# Patient Record
Sex: Male | Born: 1980 | Race: Black or African American | Hispanic: No | Marital: Single | Smoking: Former smoker
Health system: Southern US, Community
[De-identification: ages and names within clinical notes are randomized; demographics above are authoritative.]

## PROBLEM LIST (undated history)

## (undated) ENCOUNTER — Ambulatory Visit (HOSPITAL_COMMUNITY): Payer: BLUE CROSS/BLUE SHIELD | Source: Home / Self Care

## (undated) DIAGNOSIS — F419 Anxiety disorder, unspecified: Secondary | ICD-10-CM

## (undated) DIAGNOSIS — E785 Hyperlipidemia, unspecified: Secondary | ICD-10-CM

## (undated) DIAGNOSIS — M5430 Sciatica, unspecified side: Secondary | ICD-10-CM

## (undated) DIAGNOSIS — M1990 Unspecified osteoarthritis, unspecified site: Secondary | ICD-10-CM

## (undated) DIAGNOSIS — W3400XA Accidental discharge from unspecified firearms or gun, initial encounter: Secondary | ICD-10-CM

## (undated) DIAGNOSIS — F410 Panic disorder [episodic paroxysmal anxiety] without agoraphobia: Secondary | ICD-10-CM

## (undated) DIAGNOSIS — J45909 Unspecified asthma, uncomplicated: Secondary | ICD-10-CM

## (undated) DIAGNOSIS — I10 Essential (primary) hypertension: Secondary | ICD-10-CM

## (undated) DIAGNOSIS — Z8616 Personal history of COVID-19: Secondary | ICD-10-CM

## (undated) DIAGNOSIS — I2510 Atherosclerotic heart disease of native coronary artery without angina pectoris: Secondary | ICD-10-CM

## (undated) HISTORY — DX: Anxiety disorder, unspecified: F41.9

## (undated) HISTORY — DX: Personal history of COVID-19: Z86.16

## (undated) HISTORY — DX: Accidental discharge from unspecified firearms or gun, initial encounter: W34.00XA

## (undated) HISTORY — DX: Hyperlipidemia, unspecified: E78.5

## (undated) HISTORY — DX: Panic disorder (episodic paroxysmal anxiety): F41.0

## (undated) HISTORY — PX: APPENDECTOMY: SHX54

---

## 1998-12-05 ENCOUNTER — Emergency Department (HOSPITAL_COMMUNITY): Admission: EM | Admit: 1998-12-05 | Discharge: 1998-12-05 | Payer: Self-pay | Admitting: Emergency Medicine

## 1999-06-13 ENCOUNTER — Emergency Department (HOSPITAL_COMMUNITY): Admission: EM | Admit: 1999-06-13 | Discharge: 1999-06-13 | Payer: Self-pay | Admitting: Emergency Medicine

## 1999-06-17 ENCOUNTER — Emergency Department (HOSPITAL_COMMUNITY): Admission: EM | Admit: 1999-06-17 | Discharge: 1999-06-17 | Payer: Self-pay | Admitting: Emergency Medicine

## 1999-11-16 ENCOUNTER — Emergency Department (HOSPITAL_COMMUNITY): Admission: EM | Admit: 1999-11-16 | Discharge: 1999-11-16 | Payer: Self-pay | Admitting: Podiatry

## 2000-08-22 ENCOUNTER — Emergency Department (HOSPITAL_COMMUNITY): Admission: EM | Admit: 2000-08-22 | Discharge: 2000-08-22 | Payer: Self-pay | Admitting: Emergency Medicine

## 2000-08-22 ENCOUNTER — Encounter: Payer: Self-pay | Admitting: Emergency Medicine

## 2000-08-24 ENCOUNTER — Emergency Department (HOSPITAL_COMMUNITY): Admission: EM | Admit: 2000-08-24 | Discharge: 2000-08-24 | Payer: Self-pay | Admitting: Emergency Medicine

## 2002-01-27 ENCOUNTER — Emergency Department (HOSPITAL_COMMUNITY): Admission: EM | Admit: 2002-01-27 | Discharge: 2002-01-27 | Payer: Self-pay | Admitting: Emergency Medicine

## 2002-02-06 ENCOUNTER — Emergency Department (HOSPITAL_COMMUNITY): Admission: EM | Admit: 2002-02-06 | Discharge: 2002-02-06 | Payer: Self-pay | Admitting: Emergency Medicine

## 2002-02-08 ENCOUNTER — Emergency Department (HOSPITAL_COMMUNITY): Admission: EM | Admit: 2002-02-08 | Discharge: 2002-02-08 | Payer: Self-pay

## 2002-03-26 ENCOUNTER — Emergency Department (HOSPITAL_COMMUNITY): Admission: EM | Admit: 2002-03-26 | Discharge: 2002-03-26 | Payer: Self-pay | Admitting: Emergency Medicine

## 2002-03-27 ENCOUNTER — Encounter: Payer: Self-pay | Admitting: Emergency Medicine

## 2002-08-11 ENCOUNTER — Emergency Department (HOSPITAL_COMMUNITY): Admission: EM | Admit: 2002-08-11 | Discharge: 2002-08-11 | Payer: Self-pay | Admitting: Emergency Medicine

## 2002-08-11 ENCOUNTER — Encounter: Payer: Self-pay | Admitting: Emergency Medicine

## 2002-10-14 ENCOUNTER — Emergency Department (HOSPITAL_COMMUNITY): Admission: EM | Admit: 2002-10-14 | Discharge: 2002-10-14 | Payer: Self-pay | Admitting: Emergency Medicine

## 2004-02-16 ENCOUNTER — Emergency Department (HOSPITAL_COMMUNITY): Admission: EM | Admit: 2004-02-16 | Discharge: 2004-02-16 | Payer: Self-pay | Admitting: Emergency Medicine

## 2005-09-24 ENCOUNTER — Emergency Department (HOSPITAL_COMMUNITY): Admission: EM | Admit: 2005-09-24 | Discharge: 2005-09-24 | Payer: Self-pay | Admitting: *Deleted

## 2005-11-01 ENCOUNTER — Emergency Department (HOSPITAL_COMMUNITY): Admission: EM | Admit: 2005-11-01 | Discharge: 2005-11-01 | Payer: Self-pay | Admitting: Emergency Medicine

## 2006-01-18 ENCOUNTER — Emergency Department (HOSPITAL_COMMUNITY): Admission: EM | Admit: 2006-01-18 | Discharge: 2006-01-18 | Payer: Self-pay | Admitting: *Deleted

## 2006-02-19 ENCOUNTER — Emergency Department (HOSPITAL_COMMUNITY): Admission: EM | Admit: 2006-02-19 | Discharge: 2006-02-19 | Payer: Self-pay | Admitting: Emergency Medicine

## 2006-05-17 ENCOUNTER — Emergency Department (HOSPITAL_COMMUNITY): Admission: EM | Admit: 2006-05-17 | Discharge: 2006-05-17 | Payer: Self-pay | Admitting: Emergency Medicine

## 2006-10-09 ENCOUNTER — Emergency Department (HOSPITAL_COMMUNITY): Admission: EM | Admit: 2006-10-09 | Discharge: 2006-10-09 | Payer: Self-pay | Admitting: Emergency Medicine

## 2006-10-10 ENCOUNTER — Emergency Department (HOSPITAL_COMMUNITY): Admission: EM | Admit: 2006-10-10 | Discharge: 2006-10-10 | Payer: Self-pay | Admitting: Emergency Medicine

## 2006-10-12 ENCOUNTER — Emergency Department (HOSPITAL_COMMUNITY): Admission: EM | Admit: 2006-10-12 | Discharge: 2006-10-12 | Payer: Self-pay | Admitting: Emergency Medicine

## 2006-10-13 ENCOUNTER — Emergency Department (HOSPITAL_COMMUNITY): Admission: EM | Admit: 2006-10-13 | Discharge: 2006-10-13 | Payer: Self-pay | Admitting: Emergency Medicine

## 2006-10-19 ENCOUNTER — Ambulatory Visit: Payer: Self-pay | Admitting: Family Medicine

## 2006-10-27 ENCOUNTER — Ambulatory Visit: Payer: Self-pay | Admitting: Family Medicine

## 2006-10-28 ENCOUNTER — Emergency Department (HOSPITAL_COMMUNITY): Admission: EM | Admit: 2006-10-28 | Discharge: 2006-10-28 | Payer: Self-pay | Admitting: Emergency Medicine

## 2006-11-02 ENCOUNTER — Emergency Department (HOSPITAL_COMMUNITY): Admission: EM | Admit: 2006-11-02 | Discharge: 2006-11-02 | Payer: Self-pay | Admitting: Emergency Medicine

## 2006-11-05 ENCOUNTER — Emergency Department (HOSPITAL_COMMUNITY): Admission: EM | Admit: 2006-11-05 | Discharge: 2006-11-05 | Payer: Self-pay | Admitting: Emergency Medicine

## 2006-11-11 ENCOUNTER — Emergency Department (HOSPITAL_COMMUNITY): Admission: EM | Admit: 2006-11-11 | Discharge: 2006-11-11 | Payer: Self-pay | Admitting: Emergency Medicine

## 2007-09-08 ENCOUNTER — Encounter (INDEPENDENT_AMBULATORY_CARE_PROVIDER_SITE_OTHER): Payer: Self-pay | Admitting: *Deleted

## 2007-09-29 ENCOUNTER — Emergency Department (HOSPITAL_COMMUNITY): Admission: EM | Admit: 2007-09-29 | Discharge: 2007-09-29 | Payer: Self-pay | Admitting: Emergency Medicine

## 2008-03-01 ENCOUNTER — Emergency Department (HOSPITAL_COMMUNITY): Admission: EM | Admit: 2008-03-01 | Discharge: 2008-03-01 | Payer: Self-pay | Admitting: Emergency Medicine

## 2008-06-01 ENCOUNTER — Emergency Department (HOSPITAL_COMMUNITY): Admission: EM | Admit: 2008-06-01 | Discharge: 2008-06-01 | Payer: Self-pay | Admitting: Emergency Medicine

## 2008-11-30 ENCOUNTER — Emergency Department: Payer: Self-pay | Admitting: Emergency Medicine

## 2008-11-30 ENCOUNTER — Emergency Department (HOSPITAL_COMMUNITY): Admission: EM | Admit: 2008-11-30 | Discharge: 2008-11-30 | Payer: Self-pay | Admitting: Emergency Medicine

## 2008-12-04 ENCOUNTER — Emergency Department (HOSPITAL_COMMUNITY): Admission: EM | Admit: 2008-12-04 | Discharge: 2008-12-04 | Payer: Self-pay | Admitting: Emergency Medicine

## 2009-05-09 ENCOUNTER — Emergency Department: Payer: Self-pay | Admitting: Emergency Medicine

## 2011-01-21 NOTE — Miscellaneous (Signed)
Summary: VIP  Patient: Kristopher Dunn Note: All result statuses are Final unless otherwise noted.  Tests: (1) VIP (Medications)   LLIMPORTMEDS              "Result Below..."       RESULT: NABUMETONE TABS 500 MG*TAKE ONE TABLET BY MOUTH TWICE DAILY AFTER MEALS FOR SHOULDER PAIN*10/19/2007*Last Refill: [email or web address]*******   LLIMPORTMEDS              "Result Below..."       RESULT: CYCLOBENZAPRINE HCL TABS 10 MG*TAKE ONE (1) TABLET AT BEDTIME FOR PAIN AND SPASM  Generic for FLEXERIL 10MG  TAB*10/19/2007*Last Refill: [id number]*******   LLIMPORTALLS              NKDA***  Note: An exclamation mark (!) indicates a result that was not dispersed into the flowsheet. Document Creation Date: 10/21/2007 3:02 PM _______________________________________________________________________  (1) Order result status: Final Collection or observation date-time: 09/08/2007 Requested date-time: 09/08/2007 Receipt date-time:  Reported date-time: 09/08/2007 Referring Physician:   Ordering Physician:   Specimen Source:  Source: [place] Order Number:  Lab site:

## 2011-06-14 ENCOUNTER — Emergency Department (HOSPITAL_COMMUNITY): Payer: Self-pay

## 2011-06-14 ENCOUNTER — Inpatient Hospital Stay (HOSPITAL_COMMUNITY)
Admission: EM | Admit: 2011-06-14 | Discharge: 2011-06-19 | DRG: 339 | Disposition: A | Discharge status: Home / Self Care | Payer: Self-pay | Attending: General Surgery | Admitting: General Surgery

## 2011-06-14 DIAGNOSIS — F411 Generalized anxiety disorder: Secondary | ICD-10-CM | POA: Diagnosis present

## 2011-06-14 DIAGNOSIS — K352 Acute appendicitis with generalized peritonitis, without abscess: Principal | ICD-10-CM | POA: Diagnosis present

## 2011-06-14 DIAGNOSIS — F1910 Other psychoactive substance abuse, uncomplicated: Secondary | ICD-10-CM | POA: Diagnosis present

## 2011-06-14 DIAGNOSIS — E669 Obesity, unspecified: Secondary | ICD-10-CM | POA: Diagnosis present

## 2011-06-14 DIAGNOSIS — K35209 Acute appendicitis with generalized peritonitis, without abscess, unspecified as to perforation: Principal | ICD-10-CM | POA: Diagnosis present

## 2011-06-14 DIAGNOSIS — K560 Paralytic ileus: Secondary | ICD-10-CM | POA: Diagnosis not present

## 2011-06-14 DIAGNOSIS — F410 Panic disorder [episodic paroxysmal anxiety] without agoraphobia: Secondary | ICD-10-CM | POA: Diagnosis present

## 2011-06-14 DIAGNOSIS — Z23 Encounter for immunization: Secondary | ICD-10-CM

## 2011-06-14 LAB — COMPREHENSIVE METABOLIC PANEL
ALT: 18 U/L (ref 0–53)
AST: 13 U/L (ref 0–37)
Albumin: 3.7 g/dL (ref 3.5–5.2)
Alkaline Phosphatase: 68 U/L (ref 39–117)
BUN: 15 mg/dL (ref 6–23)
CO2: 25 mEq/L (ref 19–32)
Calcium: 9.7 mg/dL (ref 8.4–10.5)
Chloride: 101 mEq/L (ref 96–112)
Creatinine, Ser: 1.17 mg/dL (ref 0.50–1.35)
GFR calc Af Amer: >60 mL/min (ref >60)
GFR calc non Af Amer: >60 mL/min (ref >60)
Glucose, Bld: 81 mg/dL (ref 70–99)
Potassium: 4.3 mEq/L (ref 3.5–5.1)
Sodium: 137 mEq/L (ref 135–145)
Total Bilirubin: 0.2 mg/dL — ABNORMAL LOW (ref 0.3–1.2)
Total Protein: 7.7 g/dL (ref 6.0–8.3)

## 2011-06-14 LAB — CBC
HCT: 41.1 % (ref 39.0–52.0)
Hemoglobin: 14.5 g/dL (ref 13.0–17.0)
MCH: 32.2 pg (ref 26.0–34.0)
MCHC: 35.3 g/dL (ref 30.0–36.0)
MCV: 91.3 fL (ref 78.0–100.0)
Platelets: 239 10*3/uL (ref 150–400)
RBC: 4.5 MIL/uL (ref 4.22–5.81)
RDW: 12.3 % (ref 11.5–15.5)
WBC: 9 10*3/uL (ref 4.0–10.5)

## 2011-06-14 LAB — DIFFERENTIAL
Basophils Absolute: 0 10*3/uL (ref 0.0–0.1)
Basophils Relative: 0 % (ref 0–1)
Eosinophils Absolute: 0.1 10*3/uL (ref 0.0–0.7)
Eosinophils Relative: 2 % (ref 0–5)
Lymphocytes Relative: 21 % (ref 12–46)
Lymphs Abs: 1.8 10*3/uL (ref 0.7–4.0)
Monocytes Absolute: 0.8 10*3/uL (ref 0.1–1.0)
Monocytes Relative: 9 % (ref 3–12)
Neutro Abs: 6.2 10*3/uL (ref 1.7–7.7)
Neutrophils Relative %: 69 % (ref 43–77)

## 2011-06-14 LAB — URINALYSIS, ROUTINE W REFLEX MICROSCOPIC
Bilirubin Urine: NEGATIVE
Glucose, UA: NEGATIVE mg/dL
Hgb urine dipstick: NEGATIVE
Ketones, ur: 15 mg/dL — AB
Nitrite: NEGATIVE
Protein, ur: NEGATIVE mg/dL
Specific Gravity, Urine: 1.025 (ref 1.005–1.030)
Urobilinogen, UA: 0.2 mg/dL (ref 0.0–1.0)
pH: 6 (ref 5.0–8.0)

## 2011-06-14 LAB — URINE MICROSCOPIC-ADD ON

## 2011-06-14 LAB — LIPASE, BLOOD: Lipase: 18 U/L (ref 11–59)

## 2011-06-14 MED ORDER — IOHEXOL 350 MG/ML SOLN
100.0000 mL | Freq: Once | INTRAVENOUS | Status: AC | PRN
  Administered 2011-06-14: 100 mL via INTRAVENOUS

## 2011-06-15 ENCOUNTER — Other Ambulatory Visit (INDEPENDENT_AMBULATORY_CARE_PROVIDER_SITE_OTHER): Payer: Self-pay | Admitting: Surgery

## 2011-06-15 LAB — URINALYSIS, ROUTINE W REFLEX MICROSCOPIC
Bilirubin Urine: NEGATIVE
Glucose, UA: NEGATIVE mg/dL
Hgb urine dipstick: NEGATIVE
Ketones, ur: NEGATIVE mg/dL
Leukocytes, UA: NEGATIVE
Nitrite: NEGATIVE
Protein, ur: NEGATIVE mg/dL
Specific Gravity, Urine: 1.014 (ref 1.005–1.030)
Urobilinogen, UA: 0.2 mg/dL (ref 0.0–1.0)
pH: 6 (ref 5.0–8.0)
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
## 2011-06-15 NOTE — Op Note (Signed)
NAMEWILFERD, RITSON              ACCOUNT NO.:  [id number]  MEDICAL RECORD NO.:  [id number]  LOCATION:  5511                         FACILITY:  [facility]  PHYSICIAN:  Wilmon Arms. Corliss Skains, M.D. DATE OF BIRTH:  [birth year]  DATE OF PROCEDURE:  06/15/2011 DATE OF DISCHARGE:                              OPERATIVE REPORT   PREOPERATIVE DIAGNOSIS:  Acute appendicitis.  POSTOPERATIVE DIAGNOSIS:  Acute perforated appendicitis.  PROCEDURE PERFORMED:  Laparoscopic appendectomy.  SURGEON:  Wilmon Arms. Corliss Skains, MD  ANESTHESIA:  General  INDICATIONS:  This is a 30 year old male who presented to the emergency apartment with a 1-week history of right-sided abdominal pain.  CT scan showed a very inflamed appendix, but no obvious sign of perforation.  We were consulted to see the patient and I recommended immediate appendectomy.  We discussed the possibility for need to convert to an open procedure given the time course of his presentation.  DESCRIPTION OF PROCEDURE:  The patient was brought to the operating room and placed in supine position on the operating table.  After an adequate level of general anesthesia was obtained, a Foley catheter was placed under sterile technique.  The patient's abdomen was shaved, prepped with ChloraPrep, and draped in a sterile fashion.  A time-out was taken to assure the proper patient and proper procedure.  We infiltrated the area above the umbilicus with 0.25% Marcaine with epinephrine.  We made a transverse incision.  Dissection was carried down the fascia.  The fascia was divided vertically.  We entered the peritoneal cavity bluntly.  A stay suture of Vicryl was placed in the fascial opening. The Hasson cannula was inserted and secured with a stay suture. Pneumoperitoneum was obtained by insufflating CO2 and maintaining maximal pressure of 15 mmHg.  The 5-mm 30-degree laparoscope was inserted.  No gross purulence was noted.  We placed a 5-mm port in the right  upper quadrant and another 5-mm in the left lower quadrant.  We removed the scope through the right upper quadrant port.  Glassman clamps were used to manipulate the small bowel.  There was a loop of terminal ileum that was densely adherent to the lateral abdominal wall over the cecum.  Beneath this area, I could feel a lot of firmness.  We bluntly dissected the small bowel free from the lateral abdominal wall and peeled this away from the anterior surface of a very large inflamed appendix.  Blunt dissection was used to try to mobilize the appendix. This was very swollen and we could see a pocket of gross purulence. This abscess was completely suctioned with minimal spillage.  We continued trying to dissect this very large inflamed appendix bluntly. We had considerable difficulty trying to distinguish between the appendix and the very edematous edge of the mesoappendix.  We began dividing what I thought was the mesoappendix in its midportion and it became obvious that I was dissecting into the wall of the appendix. There was no spillage of any stool purulence.  However, I was able to mobilize the mesoappendix and divide this with a harmonic scalpel.  We mobilized the appendix all the way back to its base at the cecum.  We then divided this with Endo-GIA  stapler blue load.  The appendix was placed in an Endocatch sac.  This was then removed through the umbilical port site.  We had to enlarge the fascial opening slightly and place significant traction on the bag.  The appendix was actually divided in two while we were pulling it out through the umbilical port site inside the bag.  This was sent for pathologic examination.  We inserted our ports.  A small of bleeding vessel was cauterized with the harmonic scalpel.  We irrigated the right lower quadrant thoroughly.  No further purulence was noted.  The staple line was intact with no sign of leakage.  We suctioned as much irrigation as  possible.  The omentum and small bowel were allowed to fall back over the inflamed area in the right lower quadrant.  Pneumoperitoneum was then released as removed our trocars.  The purse-string sutures were used to close umbilical fascia. A 4-0 Monocryl was used to close the skin incisions.  Steri-Strips and clean dressings were applied.  The patient was then extubated and brought to the recovery room in stable condition.  All sponge, instrument, and needle counts were correct.     Wilmon Arms. Corliss Skains, M.D.     MKT/MEDQ  D:  06/15/2011  T:  06/15/2011  Job:  [id number]  Electronically Signed by Manus Rudd M.D. on 06/15/2011 08:33:42 PM

## 2011-06-17 LAB — BASIC METABOLIC PANEL
BUN: 5 mg/dL — ABNORMAL LOW (ref 6–23)
CO2: 30 mEq/L (ref 19–32)
Calcium: 9.1 mg/dL (ref 8.4–10.5)
Chloride: 102 mEq/L (ref 96–112)
Creatinine, Ser: 1.04 mg/dL (ref 0.50–1.35)
GFR calc Af Amer: >60 mL/min (ref >60)
GFR calc non Af Amer: >60 mL/min (ref >60)
Glucose, Bld: 104 mg/dL — ABNORMAL HIGH (ref 70–99)
Potassium: 4.1 mEq/L (ref 3.5–5.1)
Sodium: 139 mEq/L (ref 135–145)

## 2011-06-17 LAB — CBC
HCT: 37.2 % — ABNORMAL LOW (ref 39.0–52.0)
Hemoglobin: 12.7 g/dL — ABNORMAL LOW (ref 13.0–17.0)
MCH: 31.3 pg (ref 26.0–34.0)
MCHC: 34.1 g/dL (ref 30.0–36.0)
MCV: 91.6 fL (ref 78.0–100.0)
Platelets: 237 10*3/uL (ref 150–400)
RBC: 4.06 MIL/uL — ABNORMAL LOW (ref 4.22–5.81)
RDW: 12.3 % (ref 11.5–15.5)
WBC: 7.1 10*3/uL (ref 4.0–10.5)

## 2011-06-18 LAB — CBC
HCT: 37.5 % — ABNORMAL LOW (ref 39.0–52.0)
Hemoglobin: 12.9 g/dL — ABNORMAL LOW (ref 13.0–17.0)
MCH: 31.5 pg (ref 26.0–34.0)
MCHC: 34.4 g/dL (ref 30.0–36.0)
MCV: 91.7 fL (ref 78.0–100.0)
Platelets: 263 10*3/uL (ref 150–400)
RBC: 4.09 MIL/uL — ABNORMAL LOW (ref 4.22–5.81)
RDW: 12.1 % (ref 11.5–15.5)
WBC: 5.3 10*3/uL (ref 4.0–10.5)

## 2011-06-18 LAB — BASIC METABOLIC PANEL
BUN: 4 mg/dL — ABNORMAL LOW (ref 6–23)
CO2: 28 mEq/L (ref 19–32)
Calcium: 9.2 mg/dL (ref 8.4–10.5)
Chloride: 103 mEq/L (ref 96–112)
Creatinine, Ser: 1.04 mg/dL (ref 0.50–1.35)
GFR calc Af Amer: >60 mL/min (ref >60)
GFR calc non Af Amer: >60 mL/min (ref >60)
Glucose, Bld: 98 mg/dL (ref 70–99)
Potassium: 4.3 mEq/L (ref 3.5–5.1)
Sodium: 140 mEq/L (ref 135–145)

## 2011-06-18 LAB — URINALYSIS, ROUTINE W REFLEX MICROSCOPIC
Bilirubin Urine: NEGATIVE
Glucose, UA: NEGATIVE mg/dL
Hgb urine dipstick: NEGATIVE
Ketones, ur: NEGATIVE mg/dL
Leukocytes, UA: NEGATIVE
Nitrite: NEGATIVE
Protein, ur: NEGATIVE mg/dL
Specific Gravity, Urine: 1.006 (ref 1.005–1.030)
Urobilinogen, UA: 1 mg/dL (ref 0.0–1.0)
pH: 7 (ref 5.0–8.0)

## 2011-06-19 LAB — COMPREHENSIVE METABOLIC PANEL
ALT: 11 U/L (ref 0–53)
AST: 9 U/L (ref 0–37)
Albumin: 2.8 g/dL — ABNORMAL LOW (ref 3.5–5.2)
Alkaline Phosphatase: 50 U/L (ref 39–117)
BUN: 6 mg/dL (ref 6–23)
CO2: 28 mEq/L (ref 19–32)
Calcium: 9 mg/dL (ref 8.4–10.5)
Chloride: 101 mEq/L (ref 96–112)
Creatinine, Ser: 1.03 mg/dL (ref 0.50–1.35)
GFR calc Af Amer: >60 mL/min (ref >60)
GFR calc non Af Amer: >60 mL/min (ref >60)
Glucose, Bld: 96 mg/dL (ref 70–99)
Potassium: 4 mEq/L (ref 3.5–5.1)
Sodium: 137 mEq/L (ref 135–145)
Total Bilirubin: 0.2 mg/dL — ABNORMAL LOW (ref 0.3–1.2)
Total Protein: 6.6 g/dL (ref 6.0–8.3)

## 2011-06-19 LAB — CBC
HCT: 35.7 % — ABNORMAL LOW (ref 39.0–52.0)
Hemoglobin: 12.1 g/dL — ABNORMAL LOW (ref 13.0–17.0)
MCH: 31 pg (ref 26.0–34.0)
MCHC: 33.9 g/dL (ref 30.0–36.0)
MCV: 91.5 fL (ref 78.0–100.0)
Platelets: 250 10*3/uL (ref 150–400)
RBC: 3.9 MIL/uL — ABNORMAL LOW (ref 4.22–5.81)
RDW: 12 % (ref 11.5–15.5)
WBC: 4 10*3/uL (ref 4.0–10.5)

## 2011-06-19 LAB — URINE CULTURE
Colony Count: NO GROWTH
Culture  Setup Time: 201206271243
Culture: NO GROWTH

## 2011-06-25 ENCOUNTER — Encounter (HOSPITAL_COMMUNITY): Payer: Self-pay | Admitting: Radiology

## 2011-06-25 ENCOUNTER — Emergency Department (HOSPITAL_COMMUNITY)
Admission: EM | Admit: 2011-06-25 | Discharge: 2011-06-25 | Disposition: A | Discharge status: Home / Self Care | Payer: Self-pay | Attending: Emergency Medicine | Admitting: Emergency Medicine

## 2011-06-25 ENCOUNTER — Emergency Department (HOSPITAL_COMMUNITY): Payer: Self-pay

## 2011-06-25 DIAGNOSIS — R0602 Shortness of breath: Secondary | ICD-10-CM | POA: Insufficient documentation

## 2011-06-25 DIAGNOSIS — R109 Unspecified abdominal pain: Secondary | ICD-10-CM | POA: Insufficient documentation

## 2011-06-25 DIAGNOSIS — R079 Chest pain, unspecified: Secondary | ICD-10-CM | POA: Insufficient documentation

## 2011-06-25 DIAGNOSIS — E669 Obesity, unspecified: Secondary | ICD-10-CM | POA: Insufficient documentation

## 2011-06-25 DIAGNOSIS — J45909 Unspecified asthma, uncomplicated: Secondary | ICD-10-CM | POA: Insufficient documentation

## 2011-06-25 LAB — POCT I-STAT, CHEM 8
BUN: 13 mg/dL (ref 6–23)
Calcium, Ion: 1.21 mmol/L (ref 1.12–1.32)
Chloride: 105 mEq/L (ref 96–112)
Creatinine, Ser: 1 mg/dL (ref 0.50–1.35)
Glucose, Bld: 95 mg/dL (ref 70–99)
HCT: 41 % (ref 39.0–52.0)
Hemoglobin: 13.9 g/dL (ref 13.0–17.0)
Potassium: 3.9 mEq/L (ref 3.5–5.1)
Sodium: 139 mEq/L (ref 135–145)
TCO2: 24 mmol/L (ref 0–100)

## 2011-06-25 LAB — URINALYSIS, ROUTINE W REFLEX MICROSCOPIC
Bilirubin Urine: NEGATIVE
Glucose, UA: NEGATIVE mg/dL
Hgb urine dipstick: NEGATIVE
Ketones, ur: NEGATIVE mg/dL
Leukocytes, UA: NEGATIVE
Nitrite: NEGATIVE
Protein, ur: NEGATIVE mg/dL
Specific Gravity, Urine: 1.029 (ref 1.005–1.030)
Urobilinogen, UA: 0.2 mg/dL (ref 0.0–1.0)
pH: 5.5 (ref 5.0–8.0)

## 2011-06-25 LAB — COMPREHENSIVE METABOLIC PANEL
ALT: 48 U/L (ref 0–53)
AST: 50 U/L — ABNORMAL HIGH (ref 0–37)
Albumin: 3.6 g/dL (ref 3.5–5.2)
Alkaline Phosphatase: 63 U/L (ref 39–117)
BUN: 13 mg/dL (ref 6–23)
CO2: 27 mEq/L (ref 19–32)
Calcium: 9.7 mg/dL (ref 8.4–10.5)
Chloride: 101 mEq/L (ref 96–112)
Creatinine, Ser: 0.98 mg/dL (ref 0.50–1.35)
GFR calc Af Amer: >60 mL/min (ref >60)
GFR calc non Af Amer: >60 mL/min (ref >60)
Glucose, Bld: 85 mg/dL (ref 70–99)
Potassium: 3.9 mEq/L (ref 3.5–5.1)
Sodium: 138 mEq/L (ref 135–145)
Total Bilirubin: 0.2 mg/dL — ABNORMAL LOW (ref 0.3–1.2)
Total Protein: 7.5 g/dL (ref 6.0–8.3)

## 2011-06-25 LAB — CBC
HCT: 39.1 % (ref 39.0–52.0)
Hemoglobin: 13.6 g/dL (ref 13.0–17.0)
MCH: 31.6 pg (ref 26.0–34.0)
MCHC: 34.8 g/dL (ref 30.0–36.0)
MCV: 90.9 fL (ref 78.0–100.0)
Platelets: 316 10*3/uL (ref 150–400)
RBC: 4.3 MIL/uL (ref 4.22–5.81)
RDW: 12.1 % (ref 11.5–15.5)
WBC: 3.5 10*3/uL — ABNORMAL LOW (ref 4.0–10.5)

## 2011-06-25 LAB — D-DIMER, QUANTITATIVE: D-Dimer, Quant: 1.93 ug/mL-FEU — ABNORMAL HIGH (ref 0.00–0.48)

## 2011-06-25 MED ORDER — IOHEXOL 350 MG/ML SOLN
100.0000 mL | Freq: Once | INTRAVENOUS | Status: AC | PRN
  Administered 2011-06-25: 100 mL via INTRAVENOUS
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
## 2011-07-07 ENCOUNTER — Encounter (INDEPENDENT_AMBULATORY_CARE_PROVIDER_SITE_OTHER): Payer: Self-pay | Admitting: Surgery

## 2011-07-08 ENCOUNTER — Encounter (INDEPENDENT_AMBULATORY_CARE_PROVIDER_SITE_OTHER): Payer: Self-pay | Admitting: Surgery

## 2011-08-03 ENCOUNTER — Emergency Department (HOSPITAL_COMMUNITY)
Admission: EM | Admit: 2011-08-03 | Discharge: 2011-08-03 | Disposition: A | Discharge status: Home / Self Care | Payer: Self-pay | Attending: Emergency Medicine | Admitting: Emergency Medicine

## 2011-08-03 DIAGNOSIS — R220 Localized swelling, mass and lump, head: Secondary | ICD-10-CM | POA: Insufficient documentation

## 2011-08-03 DIAGNOSIS — S0180XA Unspecified open wound of other part of head, initial encounter: Secondary | ICD-10-CM | POA: Insufficient documentation

## 2011-08-03 DIAGNOSIS — R221 Localized swelling, mass and lump, neck: Secondary | ICD-10-CM | POA: Insufficient documentation

## 2011-08-03 DIAGNOSIS — W268XXA Contact with other sharp object(s), not elsewhere classified, initial encounter: Secondary | ICD-10-CM | POA: Insufficient documentation

## 2011-08-03 DIAGNOSIS — S0560XA Penetrating wound without foreign body of unspecified eyeball, initial encounter: Secondary | ICD-10-CM | POA: Insufficient documentation

## 2011-08-03 DIAGNOSIS — H5710 Ocular pain, unspecified eye: Secondary | ICD-10-CM | POA: Insufficient documentation

## 2011-12-31 ENCOUNTER — Encounter (HOSPITAL_COMMUNITY): Payer: Self-pay | Admitting: Emergency Medicine

## 2011-12-31 ENCOUNTER — Emergency Department (HOSPITAL_COMMUNITY)
Admission: EM | Admit: 2011-12-31 | Discharge: 2011-12-31 | Disposition: A | Discharge status: Home / Self Care | Payer: Self-pay | Attending: Emergency Medicine | Admitting: Emergency Medicine

## 2011-12-31 ENCOUNTER — Emergency Department (HOSPITAL_COMMUNITY): Payer: Self-pay

## 2011-12-31 DIAGNOSIS — R198 Other specified symptoms and signs involving the digestive system and abdomen: Secondary | ICD-10-CM

## 2011-12-31 DIAGNOSIS — R109 Unspecified abdominal pain: Secondary | ICD-10-CM | POA: Insufficient documentation

## 2011-12-31 DIAGNOSIS — F458 Other somatoform disorders: Secondary | ICD-10-CM | POA: Insufficient documentation

## 2011-12-31 DIAGNOSIS — R0989 Other specified symptoms and signs involving the circulatory and respiratory systems: Secondary | ICD-10-CM

## 2011-12-31 DIAGNOSIS — R1310 Dysphagia, unspecified: Secondary | ICD-10-CM | POA: Insufficient documentation

## 2011-12-31 LAB — GLUCOSE, CAPILLARY: Glucose-Capillary: 76 mg/dL (ref 70–99)

## 2011-12-31 MED ORDER — SODIUM CHLORIDE 0.9 % IV BOLUS (SEPSIS): 250.0000 mL | Freq: Once | INTRAVENOUS | Status: DC

## 2011-12-31 MED ORDER — ACETAMINOPHEN 325 MG PO TABS
650.0000 mg | ORAL_TABLET | Freq: Once | ORAL | Status: AC
Start: 2011-12-31 — End: 2011-12-31
  Administered 2011-12-31: 650 mg via ORAL
  Filled 2011-12-31: qty 2

## 2011-12-31 NOTE — ED Notes (Signed)
Pt seen by EDNP prior to RN assessment, see NP notes, orders received and initiated. Pt mentions various issues/ worries/ complaints. Mentions: low L back pain & LLQ pain (mild), also HA, dizziness, blurred vision, trouble swallowing food "sometimes", mild intermittant sore throat. (denies: fever, cough, congestion, cold sx, nvd, urinary sx, hematuria or other sx).

## 2011-12-31 NOTE — ED Notes (Signed)
Pt c/o pain in left lower back and left flank pain onset 3 days ago.  Also st's he can't eat denies nausea or vomiting.  Denies sore throat, st's food feels like it is getting stuck in his throat
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
## 2012-01-01 NOTE — ED Provider Notes (Signed)
Medical screening examination/treatment/procedure(s) were performed by non-physician practitioner and as supervising physician I was immediately available for consultation/collaboration.  Flint Melter, MD 01/01/12 [phone]

## 2012-01-02 ENCOUNTER — Emergency Department (INDEPENDENT_AMBULATORY_CARE_PROVIDER_SITE_OTHER): Payer: Self-pay

## 2012-01-02 ENCOUNTER — Emergency Department (HOSPITAL_COMMUNITY)
Admission: EM | Admit: 2012-01-02 | Discharge: 2012-01-02 | Disposition: A | Discharge status: Home / Self Care | Payer: Self-pay | Source: Home / Self Care | Attending: Emergency Medicine | Admitting: Emergency Medicine

## 2012-01-02 ENCOUNTER — Encounter (HOSPITAL_COMMUNITY): Payer: Self-pay | Admitting: Emergency Medicine

## 2012-01-02 DIAGNOSIS — J020 Streptococcal pharyngitis: Secondary | ICD-10-CM

## 2012-01-02 DIAGNOSIS — K122 Cellulitis and abscess of mouth: Secondary | ICD-10-CM

## 2012-01-02 LAB — POCT RAPID STREP A: Streptococcus, Group A Screen (Direct): POSITIVE — AB

## 2012-01-02 MED ORDER — PENICILLIN V POTASSIUM 500 MG PO TABS: 500.0000 mg | ORAL_TABLET | Freq: Two times a day (BID) | ORAL | Status: DC

## 2012-01-02 MED ORDER — IBUPROFEN 600 MG PO TABS: 600.0000 mg | ORAL_TABLET | Freq: Four times a day (QID) | ORAL | Status: DC | PRN

## 2012-01-02 MED ORDER — LIDOCAINE VISCOUS 2 % MT SOLN: 10.0000 mL | Freq: Three times a day (TID) | OROMUCOSAL | Status: DC | PRN
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
## 2012-01-02 NOTE — ED Notes (Signed)
PT RETURNS TODAY WITH DYSPHAGIA OF SOLID FOODS ONLY X 4DYS.C/O CHOKING WITH ENTRANCE OF FOOD IN THROAT.PT WAS SEEN IN ER AND DIAG WITH GLOBUS SYNDROME AND REFERRED TO [facility] BUT STATES THEY WOULDN'T SEE ME DUE TO PAYMENT ON APPT.PT STATES I DON'T HAVE 100 DOLLARS RIGHT NOW.NO VOMITING REPORTED.PT ALSO HAS HX PANIC ATTACKS AND WITH READING LITERATURE ON DIAG THIS CAN TRIGGER.DENIES PAIN.SOFT TISSUE NECK XRAY SHOWS EPIGLOTTIS

## 2012-01-03 ENCOUNTER — Encounter (HOSPITAL_COMMUNITY): Payer: Self-pay | Admitting: *Deleted

## 2012-01-03 ENCOUNTER — Emergency Department (HOSPITAL_COMMUNITY)
Admission: EM | Admit: 2012-01-03 | Discharge: 2012-01-03 | Disposition: A | Discharge status: Home / Self Care | Payer: Self-pay | Attending: Emergency Medicine | Admitting: Emergency Medicine

## 2012-01-03 DIAGNOSIS — J3489 Other specified disorders of nose and nasal sinuses: Secondary | ICD-10-CM | POA: Insufficient documentation

## 2012-01-03 DIAGNOSIS — R1310 Dysphagia, unspecified: Secondary | ICD-10-CM | POA: Insufficient documentation

## 2012-01-03 DIAGNOSIS — J029 Acute pharyngitis, unspecified: Secondary | ICD-10-CM | POA: Insufficient documentation

## 2012-01-03 DIAGNOSIS — R0989 Other specified symptoms and signs involving the circulatory and respiratory systems: Secondary | ICD-10-CM

## 2012-01-03 DIAGNOSIS — F458 Other somatoform disorders: Secondary | ICD-10-CM | POA: Insufficient documentation

## 2012-01-03 DIAGNOSIS — R198 Other specified symptoms and signs involving the digestive system and abdomen: Secondary | ICD-10-CM

## 2012-01-03 MED ORDER — DEXAMETHASONE SODIUM PHOSPHATE 10 MG/ML IJ SOLN
10.0000 mg | Freq: Once | INTRAMUSCULAR | Status: AC
  Administered 2012-01-03: 10 mg via INTRAMUSCULAR

## 2012-01-03 MED ORDER — KETOROLAC TROMETHAMINE 60 MG/2ML IM SOLN
60.0000 mg | Freq: Once | INTRAMUSCULAR | Status: AC
  Administered 2012-01-03: 60 mg via INTRAMUSCULAR
  Filled 2012-01-03: qty 2

## 2012-01-03 MED ORDER — DEXAMETHASONE 1 MG/ML PO CONC
10.0000 mg | ORAL | Status: DC
  Filled 2012-01-03: qty 10

## 2012-01-03 MED ORDER — DEXAMETHASONE SODIUM PHOSPHATE 10 MG/ML IJ SOLN
INTRAMUSCULAR | Status: AC
  Filled 2012-01-03: qty 1

## 2012-01-03 MED ORDER — LIDOCAINE VISCOUS 2 % MT SOLN
20.0000 mL | Freq: Once | OROMUCOSAL | Status: AC
  Administered 2012-01-03: 15 mL via OROMUCOSAL
  Filled 2012-01-03: qty 15
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
## 2012-01-03 NOTE — ED Notes (Signed)
Pt arrived by [facility] for congestion and sore throat. Went to [facility] yesterday, given prescription for penicillin, but has not started taking it yet due to etoh, cocaine, and marijauna use.

## 2012-03-27 ENCOUNTER — Emergency Department (HOSPITAL_COMMUNITY)
Admission: EM | Admit: 2012-03-27 | Discharge: 2012-03-27 | Disposition: A | Discharge status: Home Health | Payer: Self-pay | Attending: Emergency Medicine | Admitting: Emergency Medicine

## 2012-03-27 ENCOUNTER — Encounter (HOSPITAL_COMMUNITY): Payer: Self-pay | Admitting: Family Medicine

## 2012-03-27 ENCOUNTER — Other Ambulatory Visit: Payer: Self-pay

## 2012-03-27 DIAGNOSIS — M25519 Pain in unspecified shoulder: Secondary | ICD-10-CM | POA: Insufficient documentation

## 2012-03-27 DIAGNOSIS — R079 Chest pain, unspecified: Secondary | ICD-10-CM | POA: Insufficient documentation

## 2012-03-27 DIAGNOSIS — M542 Cervicalgia: Secondary | ICD-10-CM | POA: Insufficient documentation

## 2012-03-27 DIAGNOSIS — M549 Dorsalgia, unspecified: Secondary | ICD-10-CM | POA: Insufficient documentation

## 2012-03-27 DIAGNOSIS — G8929 Other chronic pain: Secondary | ICD-10-CM | POA: Insufficient documentation

## 2012-03-27 NOTE — ED Notes (Signed)
Pt c/o worsening chronic pain of neck, shoulder, upper back, chest for past 2 days. Pt states he has had pain for 10 years.

## 2012-03-28 ENCOUNTER — Emergency Department (HOSPITAL_COMMUNITY)
Admission: EM | Admit: 2012-03-28 | Discharge: 2012-03-28 | Disposition: A | Discharge status: Home / Self Care | Payer: Self-pay | Attending: Emergency Medicine | Admitting: Emergency Medicine

## 2012-03-28 ENCOUNTER — Other Ambulatory Visit: Payer: Self-pay

## 2012-03-28 ENCOUNTER — Emergency Department (HOSPITAL_COMMUNITY): Payer: Self-pay

## 2012-03-28 DIAGNOSIS — M549 Dorsalgia, unspecified: Secondary | ICD-10-CM | POA: Insufficient documentation

## 2012-03-28 DIAGNOSIS — G8929 Other chronic pain: Secondary | ICD-10-CM | POA: Insufficient documentation

## 2012-03-28 DIAGNOSIS — R1310 Dysphagia, unspecified: Secondary | ICD-10-CM | POA: Insufficient documentation

## 2012-03-28 DIAGNOSIS — M25519 Pain in unspecified shoulder: Secondary | ICD-10-CM | POA: Insufficient documentation

## 2012-03-28 DIAGNOSIS — M542 Cervicalgia: Secondary | ICD-10-CM | POA: Insufficient documentation

## 2012-03-28 DIAGNOSIS — R209 Unspecified disturbances of skin sensation: Secondary | ICD-10-CM | POA: Insufficient documentation

## 2012-03-28 DIAGNOSIS — Z79899 Other long term (current) drug therapy: Secondary | ICD-10-CM | POA: Insufficient documentation

## 2012-03-28 MED ORDER — HYDROCODONE-ACETAMINOPHEN 5-325 MG PO TABS: 2.0000 | ORAL_TABLET | ORAL | Status: DC | PRN

## 2012-03-28 NOTE — ED Notes (Signed)
EKG printed and handed to EDP Linker.
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
## 2012-03-28 NOTE — ED Notes (Signed)
Patient given discharge instructions, information, prescriptions, and diet order. Patient states that they adequately understand discharge information given and to return to ED if symptoms return or worsen.     

## 2012-03-28 NOTE — Discharge Instructions (Signed)
Return to the Emergency Department if symptoms change or worsen, you develop fever, pain in your chest, shortness of breath, or any other symptoms concerning to you.
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
## 2012-03-31 NOTE — ED Provider Notes (Signed)
Medical screening examination/treatment/procedure(s) were performed by non-physician practitioner and as supervising physician I was immediately available for consultation/collaboration.  Ethelda Chick, MD 03/31/12 [phone]

## 2012-04-05 ENCOUNTER — Emergency Department (HOSPITAL_COMMUNITY)
Admission: EM | Admit: 2012-04-05 | Discharge: 2012-04-06 | Disposition: A | Discharge status: Home / Self Care | Payer: Self-pay | Attending: Emergency Medicine | Admitting: Emergency Medicine

## 2012-04-05 ENCOUNTER — Encounter (HOSPITAL_COMMUNITY): Payer: Self-pay | Admitting: Emergency Medicine

## 2012-04-05 DIAGNOSIS — X58XXXA Exposure to other specified factors, initial encounter: Secondary | ICD-10-CM | POA: Insufficient documentation

## 2012-04-05 DIAGNOSIS — S46912A Strain of unspecified muscle, fascia and tendon at shoulder and upper arm level, left arm, initial encounter: Secondary | ICD-10-CM

## 2012-04-05 DIAGNOSIS — M25519 Pain in unspecified shoulder: Secondary | ICD-10-CM | POA: Insufficient documentation

## 2012-04-05 DIAGNOSIS — M542 Cervicalgia: Secondary | ICD-10-CM | POA: Insufficient documentation

## 2012-04-05 DIAGNOSIS — F411 Generalized anxiety disorder: Secondary | ICD-10-CM | POA: Insufficient documentation

## 2012-04-05 DIAGNOSIS — IMO0002 Reserved for concepts with insufficient information to code with codable children: Secondary | ICD-10-CM | POA: Insufficient documentation

## 2012-04-05 MED ORDER — SODIUM CHLORIDE 0.9 % IV BOLUS (SEPSIS): 1000.0000 mL | Freq: Once | INTRAVENOUS | Status: DC

## 2012-04-05 NOTE — ED Notes (Signed)
Pt states he feels like his heart is about to beat out of his chest  Pt states he is having pain in his back and chest, neck and left ear and states his throat hurts  Pt states he feels like he has air up in his neck and shoulder area   Pt states he cannot even burp  Pt states he was seen here for this last week and he is not feeling any better

## 2012-04-06 MED ORDER — LORAZEPAM 1 MG PO TABS
ORAL_TABLET | ORAL | Status: AC
  Administered 2012-04-06: 1 mg
  Filled 2012-04-06: qty 1

## 2012-04-06 MED ORDER — CYCLOBENZAPRINE HCL 10 MG PO TABS: 10.0000 mg | ORAL_TABLET | Freq: Two times a day (BID) | ORAL | Status: AC | PRN

## 2012-04-06 MED ORDER — NAPROXEN 500 MG PO TABS: 500.0000 mg | ORAL_TABLET | Freq: Two times a day (BID) | ORAL | Status: DC

## 2012-04-06 NOTE — Discharge Instructions (Signed)
Ligament Sprain Ligaments are tough, fibrous tissues that hold bones together at the joints. A sprain can occur when a ligament is stretched. This injury may take several weeks to heal. HOME CARE INSTRUCTIONS   Rest the injured area for as long as directed by your caregiver. Then slowly start using the joint as directed by your caregiver and as the pain allows.   Keep the affected joint raised if possible to lessen swelling.   Apply ice for 15 to 20 minutes to the injured area every couple hours for the first half day, then 3 to 4 times per day for the first 48 hours. Put the ice in a plastic bag and place a towel between the bag of ice and your skin.   Wear any splinting, casting, or elastic bandage applications as instructed.   Only take over-the-counter or prescription medicines for pain, discomfort, or fever as directed by your caregiver. Do not use aspirin immediately after the injury unless instructed by your caregiver. Aspirin can cause increased bleeding and bruising of the tissues.   If you were given crutches, continue to use them as instructed and do not resume weight bearing on the affected extremity until instructed.  SEEK MEDICAL CARE IF:   Your bruising, swelling, or pain increases.   You have cold and numb fingers  SEEK IMMEDIATE MEDICAL CARE IF:   Your pain is not responding to medicines and continues to stay the same or gets worse.  MAKE SURE YOU:   Understand these instructions.   Will watch your condition.   Will get help right away if you are not doing well or get worse.
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
## 2012-04-30 ENCOUNTER — Emergency Department (HOSPITAL_COMMUNITY)
Admission: EM | Admit: 2012-04-30 | Discharge: 2012-04-30 | Disposition: A | Discharge status: Home / Self Care | Payer: Self-pay | Attending: Emergency Medicine | Admitting: Emergency Medicine

## 2012-04-30 ENCOUNTER — Encounter (HOSPITAL_COMMUNITY): Payer: Self-pay | Admitting: *Deleted

## 2012-04-30 DIAGNOSIS — N342 Other urethritis: Secondary | ICD-10-CM | POA: Insufficient documentation

## 2012-04-30 DIAGNOSIS — Z202 Contact with and (suspected) exposure to infections with a predominantly sexual mode of transmission: Secondary | ICD-10-CM | POA: Insufficient documentation

## 2012-04-30 LAB — URINE MICROSCOPIC-ADD ON

## 2012-04-30 LAB — URINALYSIS, ROUTINE W REFLEX MICROSCOPIC
Bilirubin Urine: NEGATIVE
Glucose, UA: NEGATIVE mg/dL
Hgb urine dipstick: NEGATIVE
Ketones, ur: NEGATIVE mg/dL
Nitrite: NEGATIVE
Protein, ur: NEGATIVE mg/dL
Specific Gravity, Urine: 1.021 (ref 1.005–1.030)
Urobilinogen, UA: 1 mg/dL (ref 0.0–1.0)
pH: 7 (ref 5.0–8.0)

## 2012-04-30 MED ORDER — CEFTRIAXONE SODIUM 250 MG IJ SOLR
250.0000 mg | Freq: Once | INTRAMUSCULAR | Status: AC
  Administered 2012-04-30: 250 mg via INTRAMUSCULAR
  Filled 2012-04-30: qty 250

## 2012-04-30 MED ORDER — AZITHROMYCIN 250 MG PO TABS
1000.0000 mg | ORAL_TABLET | Freq: Once | ORAL | Status: AC
  Administered 2012-04-30: 1000 mg via ORAL
  Filled 2012-04-30: qty 4

## 2012-04-30 MED ORDER — LIDOCAINE HCL (PF) 1 % IJ SOLN
INTRAMUSCULAR | Status: AC
  Administered 2012-04-30: 5 mL
  Filled 2012-04-30: qty 5
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
## 2012-04-30 NOTE — Discharge Instructions (Signed)
RESOURCE GUIDE  Dental Problems  Patients with Medicaid: [facility]                     [facility] [address]                                           [address] Phone:  [phone]                                                  Phone:  [phone]  If unable to pay or uninsured, contact:  [facility] or [facility] to become qualified for the adult dental clinic.  Chronic Pain Problems Contact [facility]  [phone] Patients need to be referred by their primary care doctor.  Insufficient Money for Medicine Contact [employer]:  call "211" or [facility] 271-5999.  No Primary Care Doctor Call [facility]  832-8000 Other agencies that provide inexpensive medical care    [facility]  [phone]    [facility]  [phone]    [facility]  [phone]    Women's Clinic  [phone]    [facility]  [phone]    [facility]  [phone]  Psychological Services [facility]  [phone] [facility]  [phone] [facility]   [phone] (emergency services 641-4993)  Substance Abuse Resources [facility]  336-882-2125 [facility] 336-784-9470 [facility] 336-285-9073 [facility] 336-845-3988 Residential & Outpatient Substance Abuse Program  800-659-3381  Abuse/Neglect [place] Child Abuse Hotline (336) 641-3795 [place] Child Abuse Hotline 800-378-5315 (After Hours)  Emergency Shelter [facility] (336) 271-5985  Maternity Homes [facility] (336) 275-9566 [facility] (704) 372-4663  MRSA Hotline #:   832-7006    [place] Resources  [facility] [place]     [employer]                          [facility] [address] [place]                       [address]      [address]  [place]                                                [place]                            [place] Phone:  [phone]                                   Phone:  [phone]                 Phone:  [phone]  [facility] Phone:  [phone]    [facility] Child Abuse Hotline 407-274-9849 (780)374-0250 (After Hours)    Your gonorrhea and chlamydia culture is pending results, and you will receive a phone call in the next several days if it is positive.  However, you were treated empirically today with antibiotics for both gonorrhea and chlamydia.  Call your regular medical doctor today to schedule a follow up appointment within the next week.  Return to the Emergency Department immediately if worsening.
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
## 2012-05-01 LAB — URINE CULTURE
Colony Count: NO GROWTH
Culture  Setup Time: 201305102252
Culture: NO GROWTH

## 2012-05-01 LAB — GC/CHLAMYDIA PROBE AMP, GENITAL
Chlamydia, DNA Probe: NEGATIVE
GC Probe Amp, Genital: NEGATIVE

## 2012-06-22 ENCOUNTER — Emergency Department (HOSPITAL_COMMUNITY): Payer: Self-pay

## 2012-06-22 ENCOUNTER — Encounter (HOSPITAL_COMMUNITY): Payer: Self-pay | Admitting: *Deleted

## 2012-06-22 DIAGNOSIS — F411 Generalized anxiety disorder: Secondary | ICD-10-CM | POA: Insufficient documentation

## 2012-06-22 DIAGNOSIS — Z9089 Acquired absence of other organs: Secondary | ICD-10-CM | POA: Insufficient documentation

## 2012-06-22 DIAGNOSIS — K921 Melena: Secondary | ICD-10-CM | POA: Insufficient documentation

## 2012-06-22 DIAGNOSIS — R197 Diarrhea, unspecified: Secondary | ICD-10-CM | POA: Insufficient documentation

## 2012-06-22 DIAGNOSIS — R109 Unspecified abdominal pain: Secondary | ICD-10-CM | POA: Insufficient documentation

## 2012-06-22 DIAGNOSIS — F17200 Nicotine dependence, unspecified, uncomplicated: Secondary | ICD-10-CM | POA: Insufficient documentation

## 2012-06-22 LAB — COMPREHENSIVE METABOLIC PANEL
ALT: 33 U/L (ref 0–53)
AST: 31 U/L (ref 0–37)
Albumin: 3.8 g/dL (ref 3.5–5.2)
Alkaline Phosphatase: 45 U/L (ref 39–117)
BUN: 14 mg/dL (ref 6–23)
CO2: 26 mEq/L (ref 19–32)
Calcium: 9.4 mg/dL (ref 8.4–10.5)
Chloride: 101 mEq/L (ref 96–112)
Creatinine, Ser: 1.31 mg/dL (ref 0.50–1.35)
GFR calc Af Amer: 82 mL/min — ABNORMAL LOW (ref >90)
GFR calc non Af Amer: 71 mL/min — ABNORMAL LOW (ref >90)
Glucose, Bld: 94 mg/dL (ref 70–99)
Potassium: 4.1 mEq/L (ref 3.5–5.1)
Sodium: 139 mEq/L (ref 135–145)
Total Bilirubin: 0.4 mg/dL (ref 0.3–1.2)
Total Protein: 7.2 g/dL (ref 6.0–8.3)

## 2012-06-22 LAB — CBC WITH DIFFERENTIAL/PLATELET
Basophils Absolute: 0 10*3/uL (ref 0.0–0.1)
Basophils Relative: 1 % (ref 0–1)
Eosinophils Absolute: 0.3 10*3/uL (ref 0.0–0.7)
Eosinophils Relative: 5 % (ref 0–5)
HCT: 44.1 % (ref 39.0–52.0)
Hemoglobin: 15.5 g/dL (ref 13.0–17.0)
Lymphocytes Relative: 31 % (ref 12–46)
Lymphs Abs: 1.8 10*3/uL (ref 0.7–4.0)
MCH: 33 pg (ref 26.0–34.0)
MCHC: 35.1 g/dL (ref 30.0–36.0)
MCV: 94 fL (ref 78.0–100.0)
Monocytes Absolute: 0.6 10*3/uL (ref 0.1–1.0)
Monocytes Relative: 10 % (ref 3–12)
Neutro Abs: 3.1 10*3/uL (ref 1.7–7.7)
Neutrophils Relative %: 54 % (ref 43–77)
Platelets: 216 10*3/uL (ref 150–400)
RBC: 4.69 MIL/uL (ref 4.22–5.81)
RDW: 12.7 % (ref 11.5–15.5)
WBC: 5.7 10*3/uL (ref 4.0–10.5)

## 2012-06-22 LAB — LIPASE, BLOOD: Lipase: 18 U/L (ref 11–59)

## 2012-06-22 LAB — POCT I-STAT TROPONIN I: Troponin i, poc: 0 ng/mL (ref 0.00–0.08)

## 2012-06-22 NOTE — ED Notes (Addendum)
C/o abd pain, onset last Wednesday, stabbing pain, pinpoints to epigastric to diffuse abd, worse after eating and drinking, even water. Has tried alkaseltzer and peptobismol. Also mentions diarrhea. & dizziness. Has had some nv early last week. Sx not getting better, getting worse. Last ETOH 1 week ago, last Wednesday.

## 2012-06-22 NOTE — ED Notes (Signed)
Now mentions CP, some discomfort up into neck and blood in stool yesterday.

## 2012-06-23 ENCOUNTER — Emergency Department (HOSPITAL_COMMUNITY)
Admission: EM | Admit: 2012-06-23 | Discharge: 2012-06-23 | Disposition: A | Discharge status: Home / Self Care | Payer: Self-pay | Attending: Emergency Medicine | Admitting: Emergency Medicine

## 2012-06-23 DIAGNOSIS — R109 Unspecified abdominal pain: Secondary | ICD-10-CM

## 2012-06-23 NOTE — ED Notes (Signed)
Pt reports epigastric pain x 1 week; nausea associated; no vomiting; diarr.

## 2012-06-23 NOTE — ED Notes (Signed)
Pt ambulated with a steady gait;VSS; A&Ox3; no signs of distress; respirations even and unlabored; skin warm and dry; no questions at this time.  
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
## 2012-07-24 ENCOUNTER — Other Ambulatory Visit: Payer: Self-pay

## 2012-07-24 ENCOUNTER — Encounter (HOSPITAL_COMMUNITY): Payer: Self-pay | Admitting: *Deleted

## 2012-07-24 ENCOUNTER — Inpatient Hospital Stay (HOSPITAL_COMMUNITY)
Admission: EM | Admit: 2012-07-24 | Discharge: 2012-07-26 | DRG: 313 | Disposition: A | Discharge status: Home / Self Care | Payer: MEDICAID | Attending: Internal Medicine | Admitting: Internal Medicine

## 2012-07-24 ENCOUNTER — Emergency Department (HOSPITAL_COMMUNITY): Payer: Self-pay

## 2012-07-24 DIAGNOSIS — F17200 Nicotine dependence, unspecified, uncomplicated: Secondary | ICD-10-CM | POA: Diagnosis present

## 2012-07-24 DIAGNOSIS — F1010 Alcohol abuse, uncomplicated: Secondary | ICD-10-CM | POA: Diagnosis present

## 2012-07-24 DIAGNOSIS — R079 Chest pain, unspecified: Secondary | ICD-10-CM | POA: Diagnosis present

## 2012-07-24 DIAGNOSIS — F1910 Other psychoactive substance abuse, uncomplicated: Secondary | ICD-10-CM | POA: Diagnosis present

## 2012-07-24 DIAGNOSIS — R072 Precordial pain: Principal | ICD-10-CM | POA: Diagnosis present

## 2012-07-24 DIAGNOSIS — J9801 Acute bronchospasm: Secondary | ICD-10-CM | POA: Diagnosis present

## 2012-07-24 DIAGNOSIS — F10929 Alcohol use, unspecified with intoxication, unspecified: Secondary | ICD-10-CM

## 2012-07-24 DIAGNOSIS — R1310 Dysphagia, unspecified: Secondary | ICD-10-CM | POA: Diagnosis present

## 2012-07-24 DIAGNOSIS — F411 Generalized anxiety disorder: Secondary | ICD-10-CM | POA: Diagnosis present

## 2012-07-24 DIAGNOSIS — Z6839 Body mass index (BMI) 39.0-39.9, adult: Secondary | ICD-10-CM

## 2012-07-24 DIAGNOSIS — R634 Abnormal weight loss: Secondary | ICD-10-CM | POA: Diagnosis present

## 2012-07-24 DIAGNOSIS — F1410 Cocaine abuse, uncomplicated: Secondary | ICD-10-CM | POA: Diagnosis present

## 2012-07-24 LAB — CBC WITH DIFFERENTIAL/PLATELET
Basophils Absolute: 0 10*3/uL (ref 0.0–0.1)
Basophils Relative: 1 % (ref 0–1)
Eosinophils Absolute: 0 10*3/uL (ref 0.0–0.7)
Eosinophils Relative: 1 % (ref 0–5)
HCT: 39.1 % (ref 39.0–52.0)
Hemoglobin: 13.7 g/dL (ref 13.0–17.0)
Lymphocytes Relative: 24 % (ref 12–46)
Lymphs Abs: 1.4 10*3/uL (ref 0.7–4.0)
MCH: 32.4 pg (ref 26.0–34.0)
MCHC: 35 g/dL (ref 30.0–36.0)
MCV: 92.4 fL (ref 78.0–100.0)
Monocytes Absolute: 0.4 10*3/uL (ref 0.1–1.0)
Monocytes Relative: 6 % (ref 3–12)
Neutro Abs: 3.8 10*3/uL (ref 1.7–7.7)
Neutrophils Relative %: 68 % (ref 43–77)
Platelets: 194 10*3/uL (ref 150–400)
RBC: 4.23 MIL/uL (ref 4.22–5.81)
RDW: 12.4 % (ref 11.5–15.5)
WBC: 5.6 10*3/uL (ref 4.0–10.5)

## 2012-07-24 LAB — COMPREHENSIVE METABOLIC PANEL
ALT: 22 U/L (ref 0–53)
AST: 23 U/L (ref 0–37)
Albumin: 3.9 g/dL (ref 3.5–5.2)
Alkaline Phosphatase: 44 U/L (ref 39–117)
BUN: 10 mg/dL (ref 6–23)
CO2: 22 mEq/L (ref 19–32)
Calcium: 9.5 mg/dL (ref 8.4–10.5)
Chloride: 100 mEq/L (ref 96–112)
Creatinine, Ser: 1.1 mg/dL (ref 0.50–1.35)
GFR calc Af Amer: >90 mL/min (ref >90)
GFR calc non Af Amer: 87 mL/min — ABNORMAL LOW (ref >90)
Glucose, Bld: 89 mg/dL (ref 70–99)
Potassium: 3.5 mEq/L (ref 3.5–5.1)
Sodium: 138 mEq/L (ref 135–145)
Total Bilirubin: 0.4 mg/dL (ref 0.3–1.2)
Total Protein: 7.2 g/dL (ref 6.0–8.3)

## 2012-07-24 LAB — CARDIAC PANEL(CRET KIN+CKTOT+MB+TROPI)
CK, MB: 1.8 ng/mL (ref 0.3–4.0)
CK, MB: 1.6 ng/mL (ref 0.3–4.0)
Relative Index: 0.7 (ref 0.0–2.5)
Relative Index: 0.8 (ref 0.0–2.5)
Total CK: 243 U/L — ABNORMAL HIGH (ref 7–232)
Total CK: 194 U/L (ref 7–232)
Troponin I: <0.3 ng/mL (ref <0.30)
Troponin I: <0.3 ng/mL (ref <0.30)

## 2012-07-24 LAB — TROPONIN I
Troponin I: <0.3 ng/mL (ref <0.30)
Troponin I: <0.3 ng/mL (ref <0.30)

## 2012-07-24 LAB — ETHANOL: Alcohol, Ethyl (B): 121 mg/dL — ABNORMAL HIGH (ref 0–11)

## 2012-07-24 MED ORDER — ZOLPIDEM TARTRATE 5 MG PO TABS
5.0000 mg | ORAL_TABLET | Freq: Once | ORAL | Status: AC
  Administered 2012-07-24: 5 mg via ORAL
  Filled 2012-07-24: qty 1

## 2012-07-24 MED ORDER — ASPIRIN 81 MG PO CHEW
324.0000 mg | CHEWABLE_TABLET | Freq: Once | ORAL | Status: AC
  Administered 2012-07-24: 324 mg via ORAL
  Filled 2012-07-24: qty 4

## 2012-07-24 MED ORDER — ALBUTEROL SULFATE (5 MG/ML) 0.5% IN NEBU
2.5000 mg | INHALATION_SOLUTION | Freq: Once | RESPIRATORY_TRACT | Status: AC
  Administered 2012-07-24: 2.5 mg via RESPIRATORY_TRACT
  Filled 2012-07-24: qty 0.5

## 2012-07-24 MED ORDER — ONDANSETRON HCL 4 MG PO TABS: 4.0000 mg | ORAL_TABLET | Freq: Four times a day (QID) | ORAL | Status: DC | PRN

## 2012-07-24 MED ORDER — SODIUM CHLORIDE 0.9 % IV SOLN
INTRAVENOUS | Status: DC
  Administered 2012-07-24: 12:00:00 via INTRAVENOUS

## 2012-07-24 MED ORDER — ENOXAPARIN SODIUM 30 MG/0.3ML ~~LOC~~ SOLN: 30.0000 mg | SUBCUTANEOUS | Status: DC

## 2012-07-24 MED ORDER — ONDANSETRON HCL 4 MG/2ML IJ SOLN: 4.0000 mg | Freq: Four times a day (QID) | INTRAMUSCULAR | Status: DC | PRN

## 2012-07-24 MED ORDER — NICOTINE 14 MG/24HR TD PT24
14.0000 mg | MEDICATED_PATCH | Freq: Every day | TRANSDERMAL | Status: DC
  Administered 2012-07-24 – 2012-07-26 (×3): 14 mg via TRANSDERMAL
  Filled 2012-07-24 (×3): qty 1

## 2012-07-24 MED ORDER — ONDANSETRON HCL 4 MG/2ML IJ SOLN: 4.0000 mg | Freq: Three times a day (TID) | INTRAMUSCULAR | Status: DC | PRN

## 2012-07-24 MED ORDER — ACETAMINOPHEN 325 MG PO TABS: 650.0000 mg | ORAL_TABLET | Freq: Four times a day (QID) | ORAL | Status: DC | PRN

## 2012-07-24 MED ORDER — DIAZEPAM 5 MG PO TABS
5.0000 mg | ORAL_TABLET | Freq: Two times a day (BID) | ORAL | Status: AC
  Administered 2012-07-24 – 2012-07-26 (×4): 5 mg via ORAL
  Filled 2012-07-24 (×4): qty 1

## 2012-07-24 MED ORDER — ASPIRIN 81 MG PO CHEW
324.0000 mg | CHEWABLE_TABLET | Freq: Once | ORAL | Status: AC
  Administered 2012-07-25: 324 mg via ORAL
  Filled 2012-07-24: qty 4

## 2012-07-24 MED ORDER — ENOXAPARIN SODIUM 60 MG/0.6ML ~~LOC~~ SOLN
60.0000 mg | SUBCUTANEOUS | Status: DC
  Administered 2012-07-24 – 2012-07-25 (×2): 60 mg via SUBCUTANEOUS
  Filled 2012-07-24 (×3): qty 0.6

## 2012-07-24 MED ORDER — KETOROLAC TROMETHAMINE 30 MG/ML IJ SOLN
30.0000 mg | Freq: Once | INTRAMUSCULAR | Status: AC
  Administered 2012-07-24: 30 mg via INTRAVENOUS
  Filled 2012-07-24: qty 1

## 2012-07-24 MED ORDER — PANTOPRAZOLE SODIUM 40 MG PO TBEC
40.0000 mg | DELAYED_RELEASE_TABLET | Freq: Every day | ORAL | Status: DC
  Administered 2012-07-24 – 2012-07-25 (×2): 40 mg via ORAL
  Filled 2012-07-24 (×3): qty 1

## 2012-07-24 MED ORDER — ACETAMINOPHEN 650 MG RE SUPP: 650.0000 mg | Freq: Four times a day (QID) | RECTAL | Status: DC | PRN

## 2012-07-24 MED ORDER — NITROGLYCERIN 0.4 MG SL SUBL
0.4000 mg | SUBLINGUAL_TABLET | SUBLINGUAL | Status: DC | PRN
Start: 2012-07-24 — End: 2012-07-26
  Administered 2012-07-24 (×2): 0.4 mg via SUBLINGUAL
  Filled 2012-07-24 (×2): qty 25

## 2012-07-24 MED ORDER — ASPIRIN 325 MG PO TABS
325.0000 mg | ORAL_TABLET | Freq: Every day | ORAL | Status: DC
  Administered 2012-07-25 – 2012-07-26 (×2): 325 mg via ORAL
  Filled 2012-07-24 (×2): qty 1

## 2012-07-24 MED ORDER — SODIUM CHLORIDE 0.9 % IJ SOLN
3.0000 mL | Freq: Two times a day (BID) | INTRAMUSCULAR | Status: DC
  Administered 2012-07-26: 3 mL via INTRAVENOUS

## 2012-07-24 MED ORDER — ALBUTEROL SULFATE (5 MG/ML) 0.5% IN NEBU: 2.5000 mg | INHALATION_SOLUTION | Freq: Four times a day (QID) | RESPIRATORY_TRACT | Status: AC | PRN
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
## 2012-07-24 NOTE — ED Notes (Signed)
Pt placed on monitor. ekg done and given to MD. Pt on 2l/[place] o2
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
## 2012-07-24 NOTE — ED Notes (Signed)
After 1 nitrogylcerin sl. Pt states he does not have any pain. md informed.

## 2012-07-24 NOTE — ED Notes (Signed)
Pt. EKG old and new given to EDP, Glick,MD.

## 2012-07-24 NOTE — ED Notes (Signed)
1st attempt to obtain labs pt using restroom.  Will re-attempt in 10 min's.

## 2012-07-24 NOTE — ED Notes (Signed)
ZOX:[id number]<AV> Expected date:07/24/12<BR> Expected time: 2:53 AM<BR> Means of arrival:Ambulance<BR> Comments:<BR> Drug use/chest pain
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
## 2012-07-24 NOTE — ED Notes (Signed)
Pt brought in via ems and taken to room 24. Per ems pt took less than 1 gram of cocaine tonight and etoh use. Pt started c/o of chest pain and called ems. ems stated they gave pt 2.5 mg versed iv for chest pain and anxiety.

## 2012-07-25 ENCOUNTER — Inpatient Hospital Stay (HOSPITAL_COMMUNITY): Payer: Self-pay

## 2012-07-25 DIAGNOSIS — R079 Chest pain, unspecified: Secondary | ICD-10-CM

## 2012-07-25 DIAGNOSIS — R1310 Dysphagia, unspecified: Secondary | ICD-10-CM

## 2012-07-25 DIAGNOSIS — F1410 Cocaine abuse, uncomplicated: Secondary | ICD-10-CM

## 2012-07-25 DIAGNOSIS — F1010 Alcohol abuse, uncomplicated: Secondary | ICD-10-CM

## 2012-07-25 LAB — CBC
HCT: 39.6 % (ref 39.0–52.0)
Hemoglobin: 13.5 g/dL (ref 13.0–17.0)
MCH: 31.8 pg (ref 26.0–34.0)
MCHC: 34.1 g/dL (ref 30.0–36.0)
MCV: 93.4 fL (ref 78.0–100.0)
Platelets: 175 10*3/uL (ref 150–400)
RBC: 4.24 MIL/uL (ref 4.22–5.81)
RDW: 12.6 % (ref 11.5–15.5)
WBC: 3.7 10*3/uL — ABNORMAL LOW (ref 4.0–10.5)

## 2012-07-25 LAB — COMPREHENSIVE METABOLIC PANEL
ALT: 17 U/L (ref 0–53)
AST: 16 U/L (ref 0–37)
Albumin: 3.2 g/dL — ABNORMAL LOW (ref 3.5–5.2)
Alkaline Phosphatase: 39 U/L (ref 39–117)
BUN: 11 mg/dL (ref 6–23)
CO2: 23 mEq/L (ref 19–32)
Calcium: 8.6 mg/dL (ref 8.4–10.5)
Chloride: 105 mEq/L (ref 96–112)
Creatinine, Ser: 1.23 mg/dL (ref 0.50–1.35)
GFR calc Af Amer: 89 mL/min — ABNORMAL LOW (ref >90)
GFR calc non Af Amer: 76 mL/min — ABNORMAL LOW (ref >90)
Glucose, Bld: 93 mg/dL (ref 70–99)
Potassium: 3.8 mEq/L (ref 3.5–5.1)
Sodium: 140 mEq/L (ref 135–145)
Total Bilirubin: 0.5 mg/dL (ref 0.3–1.2)
Total Protein: 6.2 g/dL (ref 6.0–8.3)

## 2012-07-25 LAB — CARDIAC PANEL(CRET KIN+CKTOT+MB+TROPI)
CK, MB: 1.6 ng/mL (ref 0.3–4.0)
Relative Index: 1 (ref 0.0–2.5)
Total CK: 155 U/L (ref 7–232)
Troponin I: <0.3 ng/mL (ref <0.30)

## 2012-07-25 LAB — HIV ANTIBODY (ROUTINE TESTING W REFLEX): HIV: NONREACTIVE

## 2012-07-25 NOTE — Progress Notes (Signed)
Clinical Social Work Department BRIEF PSYCHOSOCIAL ASSESSMENT 07/25/2012  Patient:  CASIMER, RUSSETT     Account Number:  [id number]     Admit date:  07/24/2012  Clinical Social Worker:  Leron Croak, CLINICAL SOCIAL WORKER  Date/Time:  07/25/2012 02:16 PM  Referred by:  Physician  Date Referred:  07/24/2012 Referred for  Substance Abuse   Other Referral:   Interview type:  Patient Other interview type:   Fiance was also in the room.    PSYCHOSOCIAL DATA Living Status:  SIGNIFICANT OTHER Admitted from facility:   Level of care:   Primary support name:  Stephane Lovette Primary support relationship to patient:  PARENT Degree of support available:   good    CURRENT CONCERNS Current Concerns  Substance Abuse   Other Concerns:    SOCIAL WORK ASSESSMENT / PLAN CSW met with the Pt and his fiancee to provide support and offer resources for substance abuse recovery programs and Behavioral health resources.   Assessment/plan status:  Information/Referral to [facility] Other assessment/ plan:   Information/referral to community resources:   CSW provided resources for substance abuse recovery programs and Behavioral health resources    PATIENT'S/FAMILY'S RESPONSE TO PLAN OF CARE: Pt was eager to receive information and both Pt and spouse were appreciative for information and support given.       Leron Croak, LCSWA [employer] [phone]
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
## 2012-07-26 ENCOUNTER — Inpatient Hospital Stay (HOSPITAL_COMMUNITY): Payer: MEDICAID

## 2012-07-26 LAB — MAGNESIUM: Magnesium: 2 mg/dL (ref 1.5–2.5)

## 2012-07-26 MED ORDER — ASPIRIN 325 MG PO TABS: 325.0000 mg | ORAL_TABLET | Freq: Every day | ORAL | Status: DC

## 2012-07-26 MED ORDER — IOHEXOL 300 MG/ML  SOLN
80.0000 mL | Freq: Once | INTRAMUSCULAR | Status: AC | PRN
  Administered 2012-07-26: 80 mL via INTRAVENOUS

## 2012-07-26 MED ORDER — PANTOPRAZOLE SODIUM 40 MG PO TBEC: 40.0000 mg | DELAYED_RELEASE_TABLET | Freq: Every day | ORAL | Status: DC

## 2012-07-26 NOTE — Progress Notes (Signed)
DC instructions reviewed with patient and spouse.  Patient denies further questions or concerns at this time.  IV DC.  Home meds carefully reviewed with the patient.  2 rx's called into pt's pharmacy by MD.  Wife to drive patient to home.

## 2012-07-26 NOTE — Progress Notes (Signed)
Echocardiogram 2D Echocardiogram has been performed.  Kristopher Dunn 07/26/2012, 1:53 PM

## 2012-08-04 NOTE — Discharge Summary (Signed)
Physician Discharge Summary  Patient ID: QUINTAVIUS NIEBUHR MRN: [id number] DOB/AGE: 33-23-1982 33 y.o.  Admit date: 07/24/2012 Discharge date: 08/04/2012  Primary Care Physician:  Sheila Oats, MD   Discharge Diagnoses:    Active Problems:  Chest pain  Cocaine abuse  Dysphagia  Weight loss  Polysubstance abuse    Medication List  As of 08/04/2012  5:47 PM   TAKE these medications         aspirin 325 MG tablet   Take 1 tablet (325 mg total) by mouth daily.      pantoprazole 40 MG tablet   Commonly known as: PROTONIX   Take 1 tablet (40 mg total) by mouth daily at 12 noon.             Disposition and Follow-up:  PCP in 1 week ENT in 7-10days  Significant Diagnostic Studies:  CT CHEST WITH CONTRAST IMPRESSION: Normal CT scan of the chest. No significant extrinsic compression of the esophagus. No vascular ring.   ESOPHOGRAM / BARIUM SWALLOW / BARIUM TABLET STUDY IMPRESSION: 1. Trainsient posterior impression upon the proximal esophagus is favored to represent extrinsic compression from an adjacent vascular structure. Whether or not this could be related to the patient's symptoms is uncertain. Clinical correlation is recommended. This could be further evaluated with a contrast enhanced CT of the thorax if clinically indicated. 1. Tiny nonobstructive distal esophageal ring incidentally noted, unlikely to be related to patient's symptoms.   CHEST - 2 VIEW IMPRESSION: No evidence of active pulmonary disease.  2D echo Study Conclusions - Left ventricle: The cavity size was normal. There was mild concentric hypertrophy. Systolic function was normal. The estimated ejection fraction was in the range of 60% to 65%. Wall motion was normal; there were no regional wall motion abnormalities. Doppler parameters are consistent with abnormal left ventricular relaxation (grade 1 diastolic dysfunction). - Mitral valve: Mild regurgitation. - Left atrium: The atrium  was mildly dilated   Brief H and P: Mr. Treese is a 31 year old with past history of polysubstance abuse, anxiety presents to the emergency room with the above-mentioned complaint.  Patient reports to having stabbing substernal chest pain off and on for several months, which is often times self-limiting without any exacerbating or relieving factors. Sometimes worsened after cocaine use.  Late last night he started experiencing severe substernal chest pressure associated with palpitations, describes the chest pain as pressure-like radiating down the left arm. He admits using cocaine last night. Denies any fevers or chills denies cough or congestion.  In addition also reports progressive dysphagia, associated with choking-like sensation in the last 7 months he's never been worked up for this. He reports his dysphagia is predominantly towards meats and solid food, he also reports weight loss of approximately 70 pounds in 7 months  Hospital Course:  1. Substernal chest pain -resolved  Suspect cocaine-induced vasospasm  EKG unremarkable  cardiac enzymes negative  Continue aspirin 325 mg daily  benzodiazepines to control heart rate and blood pressure in the setting of recent cocaine abuse, DC after initial dose  Avoid beta blockers due to unopposed alpha action  2-D echocardiogram done, EF of 60% without regional wall motion abnormality   2. Dysphagia with weight loss  Ba esophogram done, unremerkable, CT chest and swallow eval per speech therapy normal Advised to FU with ENT in 1 week Tolerating mechanical soft diet HIV negative   3. Cocaine abuse, polysubstance abuse  Counseled  Seen by  Child psychotherapist as well  4. EtOH abuse:  Thiamine, multivitamins, counseled    Time spent on Discharge:  Signed: Kenidi Elenbaas [facility]  08/04/2012, 5:47 PM

## 2012-10-02 ENCOUNTER — Encounter (HOSPITAL_COMMUNITY): Payer: Self-pay | Admitting: *Deleted

## 2012-10-02 ENCOUNTER — Emergency Department (HOSPITAL_COMMUNITY): Payer: Self-pay

## 2012-10-02 ENCOUNTER — Emergency Department (HOSPITAL_COMMUNITY)
Admission: EM | Admit: 2012-10-02 | Discharge: 2012-10-02 | Disposition: A | Discharge status: Home / Self Care | Payer: Self-pay | Attending: Emergency Medicine | Admitting: Emergency Medicine

## 2012-10-02 DIAGNOSIS — R079 Chest pain, unspecified: Secondary | ICD-10-CM | POA: Insufficient documentation

## 2012-10-02 DIAGNOSIS — Z7982 Long term (current) use of aspirin: Secondary | ICD-10-CM | POA: Insufficient documentation

## 2012-10-02 DIAGNOSIS — Z9089 Acquired absence of other organs: Secondary | ICD-10-CM | POA: Insufficient documentation

## 2012-10-02 DIAGNOSIS — K219 Gastro-esophageal reflux disease without esophagitis: Secondary | ICD-10-CM | POA: Insufficient documentation

## 2012-10-02 DIAGNOSIS — R05 Cough: Secondary | ICD-10-CM

## 2012-10-02 DIAGNOSIS — R0602 Shortness of breath: Secondary | ICD-10-CM

## 2012-10-02 DIAGNOSIS — R059 Cough, unspecified: Secondary | ICD-10-CM

## 2012-10-02 MED ORDER — OMEPRAZOLE 20 MG PO CPDR: 20.0000 mg | DELAYED_RELEASE_CAPSULE | Freq: Every day | ORAL | Status: DC

## 2012-10-02 MED ORDER — IBUPROFEN 200 MG PO TABS
400.0000 mg | ORAL_TABLET | Freq: Once | ORAL | Status: AC
  Administered 2012-10-02: 400 mg via ORAL
  Filled 2012-10-02: qty 2

## 2012-10-02 MED ORDER — GI COCKTAIL ~~LOC~~
30.0000 mL | Freq: Once | ORAL | Status: AC
  Administered 2012-10-02: 30 mL via ORAL
  Filled 2012-10-02: qty 30

## 2012-10-02 MED ORDER — ASPIRIN 325 MG PO TABS
325.0000 mg | ORAL_TABLET | ORAL | Status: AC
  Administered 2012-10-02: 325 mg via ORAL
  Filled 2012-10-02: qty 1
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
## 2012-10-02 NOTE — ED Notes (Addendum)
Pt states he has had persistant sternal chest pain x 3d. Nothing aggravates or relieves the pain, Took aspirin 2d ago with no relief. Pt is in NAD, A&Ox4. Pt denies any trauma or injury to the area. Pt states he had this same type of pain several months ago and he was admitted and sent home. Pt denies any drug use or energy drinks.

## 2012-10-02 NOTE — ED Notes (Signed)
Patient is alert and oriented x3.  He is complaining of chest pain with shortness of breath that  Started 3 days ago.  He does have a history of the same issue that he was seen at [facility]  A month ago.  Currently he rates his pain 8 of 10 that does not radiate from the middle of his chest.   He denies nausea or vomiting.  Pain does not get worse with palpitation of his chest or deep  Respirations.

## 2012-10-04 LAB — POCT I-STAT TROPONIN I: Troponin i, poc: 0 ng/mL (ref 0.00–0.08)

## 2013-02-01 ENCOUNTER — Emergency Department (HOSPITAL_COMMUNITY): Payer: Self-pay

## 2013-02-01 ENCOUNTER — Emergency Department (HOSPITAL_COMMUNITY)
Admission: EM | Admit: 2013-02-01 | Discharge: 2013-02-01 | Disposition: A | Discharge status: Home / Self Care | Payer: Self-pay | Attending: Emergency Medicine | Admitting: Emergency Medicine

## 2013-02-01 ENCOUNTER — Encounter (HOSPITAL_COMMUNITY): Payer: Self-pay | Admitting: Emergency Medicine

## 2013-02-01 DIAGNOSIS — E669 Obesity, unspecified: Secondary | ICD-10-CM | POA: Insufficient documentation

## 2013-02-01 DIAGNOSIS — R209 Unspecified disturbances of skin sensation: Secondary | ICD-10-CM | POA: Insufficient documentation

## 2013-02-01 DIAGNOSIS — R202 Paresthesia of skin: Secondary | ICD-10-CM

## 2013-02-01 DIAGNOSIS — R0602 Shortness of breath: Secondary | ICD-10-CM | POA: Insufficient documentation

## 2013-02-01 DIAGNOSIS — Z87828 Personal history of other (healed) physical injury and trauma: Secondary | ICD-10-CM | POA: Insufficient documentation

## 2013-02-01 DIAGNOSIS — R059 Cough, unspecified: Secondary | ICD-10-CM | POA: Insufficient documentation

## 2013-02-01 DIAGNOSIS — Z8659 Personal history of other mental and behavioral disorders: Secondary | ICD-10-CM | POA: Insufficient documentation

## 2013-02-01 DIAGNOSIS — Z79899 Other long term (current) drug therapy: Secondary | ICD-10-CM | POA: Insufficient documentation

## 2013-02-01 DIAGNOSIS — R05 Cough: Secondary | ICD-10-CM | POA: Insufficient documentation

## 2013-02-01 DIAGNOSIS — J45909 Unspecified asthma, uncomplicated: Secondary | ICD-10-CM | POA: Insufficient documentation

## 2013-02-01 DIAGNOSIS — Z87891 Personal history of nicotine dependence: Secondary | ICD-10-CM | POA: Insufficient documentation

## 2013-02-01 DIAGNOSIS — R062 Wheezing: Secondary | ICD-10-CM | POA: Insufficient documentation

## 2013-02-01 DIAGNOSIS — N289 Disorder of kidney and ureter, unspecified: Secondary | ICD-10-CM | POA: Insufficient documentation

## 2013-02-01 DIAGNOSIS — Z7982 Long term (current) use of aspirin: Secondary | ICD-10-CM | POA: Insufficient documentation

## 2013-02-01 LAB — POCT I-STAT, CHEM 8
BUN: 11 mg/dL (ref 6–23)
Calcium, Ion: 1.23 mmol/L (ref 1.12–1.23)
Chloride: 106 mEq/L (ref 96–112)
Creatinine, Ser: 1.5 mg/dL — ABNORMAL HIGH (ref 0.50–1.35)
Glucose, Bld: 83 mg/dL (ref 70–99)
HCT: 43 % (ref 39.0–52.0)
Hemoglobin: 14.6 g/dL (ref 13.0–17.0)
Potassium: 4.1 mEq/L (ref 3.5–5.1)
Sodium: 142 mEq/L (ref 135–145)
TCO2: 25 mmol/L (ref 0–100)

## 2013-02-01 MED ORDER — LORAZEPAM 2 MG/ML IJ SOLN
INTRAMUSCULAR | Status: AC
  Administered 2013-02-01: 2 mg via INTRAMUSCULAR
  Filled 2013-02-01: qty 1

## 2013-02-01 MED ORDER — LORAZEPAM 2 MG/ML IJ SOLN
2.0000 mg | Freq: Once | INTRAMUSCULAR | Status: AC
  Administered 2013-02-01: 2 mg via INTRAMUSCULAR

## 2013-02-01 MED ORDER — LORAZEPAM 1 MG PO TABS
1.0000 mg | ORAL_TABLET | Freq: Once | ORAL | Status: DC
  Filled 2013-02-01: qty 1

## 2013-02-01 NOTE — ED Notes (Signed)
I gave the patient a warm blanket. 

## 2013-02-01 NOTE — ED Notes (Signed)
Per MRI- pt screaming that he is claustrophobic. Verbal order obtained for ativan

## 2013-02-01 NOTE — ED Notes (Signed)
Pt d/c home in NAD. Pt voiced understanding of d/c instructions and importance of follow up care. Pt states that he has no questions at this time.

## 2013-02-01 NOTE — ED Notes (Signed)
Mri called, stated that pt is now refusing his MRI. Pt States "Absolutely no way." MD notified
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
## 2013-02-01 NOTE — ED Notes (Signed)
Pt states he has numbness in left leg as well as pain. Pt states that numbness started last night and pain started 1 hr ago. Pt also has numbness in left arm, "But I have that all the time." Pt states he is unable to ambulate well, "it takes me a while."

## 2013-02-01 NOTE — ED Notes (Addendum)
Pt c/o left leg numbness onset last night while driving to store and left arm numbness upon awakening this morning. Equal grips B/L, equal strength in lower extremities. Pt reports history of numbness to left arm, but never had any problems with his leg

## 2013-02-07 ENCOUNTER — Emergency Department (HOSPITAL_COMMUNITY): Payer: Self-pay

## 2013-02-07 ENCOUNTER — Encounter (HOSPITAL_COMMUNITY): Payer: Self-pay | Admitting: Emergency Medicine

## 2013-02-07 ENCOUNTER — Emergency Department (HOSPITAL_COMMUNITY)
Admission: EM | Admit: 2013-02-07 | Discharge: 2013-02-07 | Disposition: A | Discharge status: Home / Self Care | Payer: Self-pay | Attending: Emergency Medicine | Admitting: Emergency Medicine

## 2013-02-07 DIAGNOSIS — Z87828 Personal history of other (healed) physical injury and trauma: Secondary | ICD-10-CM | POA: Insufficient documentation

## 2013-02-07 DIAGNOSIS — Z87891 Personal history of nicotine dependence: Secondary | ICD-10-CM | POA: Insufficient documentation

## 2013-02-07 DIAGNOSIS — M5416 Radiculopathy, lumbar region: Secondary | ICD-10-CM

## 2013-02-07 DIAGNOSIS — F419 Anxiety disorder, unspecified: Secondary | ICD-10-CM

## 2013-02-07 DIAGNOSIS — IMO0002 Reserved for concepts with insufficient information to code with codable children: Secondary | ICD-10-CM | POA: Insufficient documentation

## 2013-02-07 DIAGNOSIS — Z8659 Personal history of other mental and behavioral disorders: Secondary | ICD-10-CM | POA: Insufficient documentation

## 2013-02-07 DIAGNOSIS — F411 Generalized anxiety disorder: Secondary | ICD-10-CM | POA: Insufficient documentation

## 2013-02-07 DIAGNOSIS — R55 Syncope and collapse: Secondary | ICD-10-CM | POA: Insufficient documentation

## 2013-02-07 LAB — CBC WITH DIFFERENTIAL/PLATELET
Basophils Absolute: 0 10*3/uL (ref 0.0–0.1)
Basophils Relative: 1 % (ref 0–1)
Eosinophils Absolute: 0 10*3/uL (ref 0.0–0.7)
Eosinophils Relative: 1 % (ref 0–5)
HCT: 42.7 % (ref 39.0–52.0)
Hemoglobin: 15 g/dL (ref 13.0–17.0)
Lymphocytes Relative: 23 % (ref 12–46)
Lymphs Abs: 0.9 10*3/uL (ref 0.7–4.0)
MCH: 33 pg (ref 26.0–34.0)
MCHC: 35.1 g/dL (ref 30.0–36.0)
MCV: 93.8 fL (ref 78.0–100.0)
Monocytes Absolute: 0.3 10*3/uL (ref 0.1–1.0)
Monocytes Relative: 9 % (ref 3–12)
Neutro Abs: 2.6 10*3/uL (ref 1.7–7.7)
Neutrophils Relative %: 67 % (ref 43–77)
Platelets: 227 10*3/uL (ref 150–400)
RBC: 4.55 MIL/uL (ref 4.22–5.81)
RDW: 12.5 % (ref 11.5–15.5)
WBC: 3.9 10*3/uL — ABNORMAL LOW (ref 4.0–10.5)

## 2013-02-07 LAB — COMPREHENSIVE METABOLIC PANEL
ALT: 18 U/L (ref 0–53)
AST: 25 U/L (ref 0–37)
Albumin: 4.2 g/dL (ref 3.5–5.2)
Alkaline Phosphatase: 42 U/L (ref 39–117)
BUN: 11 mg/dL (ref 6–23)
CO2: 25 mEq/L (ref 19–32)
Calcium: 10.2 mg/dL (ref 8.4–10.5)
Chloride: 99 mEq/L (ref 96–112)
Creatinine, Ser: 1.08 mg/dL (ref 0.50–1.35)
GFR calc Af Amer: >90 mL/min (ref >90)
GFR calc non Af Amer: 89 mL/min — ABNORMAL LOW (ref >90)
Glucose, Bld: 97 mg/dL (ref 70–99)
Potassium: 3.7 mEq/L (ref 3.5–5.1)
Sodium: 137 mEq/L (ref 135–145)
Total Bilirubin: 0.4 mg/dL (ref 0.3–1.2)
Total Protein: 7.8 g/dL (ref 6.0–8.3)

## 2013-02-07 MED ORDER — OXYCODONE-ACETAMINOPHEN 5-325 MG PO TABS: 1.0000 | ORAL_TABLET | Freq: Four times a day (QID) | ORAL | Status: DC | PRN

## 2013-02-07 MED ORDER — PREDNISONE 20 MG PO TABS: ORAL_TABLET | ORAL | Status: DC

## 2013-02-07 MED ORDER — DIAZEPAM 5 MG PO TABS: ORAL_TABLET | ORAL | Status: DC

## 2013-02-07 MED ORDER — LORAZEPAM 2 MG/ML IJ SOLN
1.0000 mg | Freq: Once | INTRAMUSCULAR | Status: AC
  Administered 2013-02-07: 1 mg via INTRAVENOUS
  Filled 2013-02-07: qty 1
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
## 2013-02-07 NOTE — ED Notes (Signed)
Pt returned from Radiology.
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
## 2013-02-07 NOTE — ED Notes (Signed)
Pt placed on cardiac monitor 

## 2013-02-07 NOTE — ED Notes (Signed)
Having back pain. 
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
## 2013-02-08 NOTE — ED Provider Notes (Signed)
Medical screening examination/treatment/procedure(s) were conducted as a shared visit with non-physician practitioner(s) and myself.  I personally evaluated the patient during the encounter  Toy Baker, MD 02/08/13 1517

## 2013-07-20 ENCOUNTER — Emergency Department (HOSPITAL_COMMUNITY)
Admission: EM | Admit: 2013-07-20 | Discharge: 2013-07-20 | Disposition: A | Discharge status: Home / Self Care | Payer: Self-pay | Attending: Emergency Medicine | Admitting: Emergency Medicine

## 2013-07-20 ENCOUNTER — Encounter (HOSPITAL_COMMUNITY): Payer: Self-pay | Admitting: Emergency Medicine

## 2013-07-20 DIAGNOSIS — Z8659 Personal history of other mental and behavioral disorders: Secondary | ICD-10-CM | POA: Insufficient documentation

## 2013-07-20 DIAGNOSIS — Z87891 Personal history of nicotine dependence: Secondary | ICD-10-CM | POA: Insufficient documentation

## 2013-07-20 DIAGNOSIS — M542 Cervicalgia: Secondary | ICD-10-CM | POA: Insufficient documentation

## 2013-07-20 DIAGNOSIS — Z87828 Personal history of other (healed) physical injury and trauma: Secondary | ICD-10-CM | POA: Insufficient documentation

## 2013-07-20 DIAGNOSIS — F411 Generalized anxiety disorder: Secondary | ICD-10-CM | POA: Insufficient documentation

## 2013-07-20 DIAGNOSIS — Z79899 Other long term (current) drug therapy: Secondary | ICD-10-CM | POA: Insufficient documentation

## 2013-07-20 MED ORDER — METHOCARBAMOL 500 MG PO TABS: 500.0000 mg | ORAL_TABLET | Freq: Two times a day (BID) | ORAL | Status: DC | PRN
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
## 2013-07-20 NOTE — ED Notes (Signed)
C/O right neck pain x 4-5 days. Started while driving. States "always has tingling in fingers and anxiety".

## 2014-01-24 ENCOUNTER — Encounter (HOSPITAL_COMMUNITY): Payer: Self-pay | Admitting: Emergency Medicine

## 2014-01-24 ENCOUNTER — Emergency Department (HOSPITAL_COMMUNITY)
Admission: EM | Admit: 2014-01-24 | Discharge: 2014-01-24 | Disposition: A | Discharge status: Home / Self Care | Payer: BC Managed Care – PPO | Attending: Emergency Medicine | Admitting: Emergency Medicine

## 2014-01-24 DIAGNOSIS — S46812A Strain of other muscles, fascia and tendons at shoulder and upper arm level, left arm, initial encounter: Secondary | ICD-10-CM

## 2014-01-24 DIAGNOSIS — Z87891 Personal history of nicotine dependence: Secondary | ICD-10-CM | POA: Insufficient documentation

## 2014-01-24 DIAGNOSIS — S46819A Strain of other muscles, fascia and tendons at shoulder and upper arm level, unspecified arm, initial encounter: Secondary | ICD-10-CM | POA: Insufficient documentation

## 2014-01-24 DIAGNOSIS — Z8659 Personal history of other mental and behavioral disorders: Secondary | ICD-10-CM | POA: Insufficient documentation

## 2014-01-24 DIAGNOSIS — Y929 Unspecified place or not applicable: Secondary | ICD-10-CM | POA: Insufficient documentation

## 2014-01-24 DIAGNOSIS — Z791 Long term (current) use of non-steroidal anti-inflammatories (NSAID): Secondary | ICD-10-CM | POA: Insufficient documentation

## 2014-01-24 DIAGNOSIS — S43499A Other sprain of unspecified shoulder joint, initial encounter: Secondary | ICD-10-CM | POA: Insufficient documentation

## 2014-01-24 DIAGNOSIS — X58XXXA Exposure to other specified factors, initial encounter: Secondary | ICD-10-CM | POA: Insufficient documentation

## 2014-01-24 DIAGNOSIS — Y939 Activity, unspecified: Secondary | ICD-10-CM | POA: Insufficient documentation

## 2014-01-24 MED ORDER — NAPROXEN 500 MG PO TABS: 500.0000 mg | ORAL_TABLET | Freq: Two times a day (BID) | ORAL | Status: DC

## 2014-01-24 MED ORDER — CYCLOBENZAPRINE HCL 10 MG PO TABS: 10.0000 mg | ORAL_TABLET | Freq: Two times a day (BID) | ORAL | Status: DC | PRN

## 2014-01-24 NOTE — ED Notes (Addendum)
Pt escorted to discharge window. Verbalized understanding discharge instructions. In no acute distress.  Vitals were take within the hour and WDL.

## 2014-01-24 NOTE — ED Notes (Addendum)
Pt c/o intermittent R posterior shoulder pain x 1 month.  Pain score 8/10.  Denies injury.  Sts "about a month ago, I woke up with it, but it went away.  It started back yesterday morning and is worse."  Denies numbness and tingling in fingers.  Denies any new lifting or exercising.

## 2014-01-24 NOTE — Discharge Instructions (Signed)
Take flexeril or use heat pack for muscle spasm and tightness. Use naproxen for pain (it is similar to ibuprofen) If you were given medicines take as directed.  If you are on coumadin or contraceptives realize their levels and effectiveness is altered by many different medicines.  If you have any reaction (rash, tongues swelling, other) to the medicines stop taking and see a physician.   Please follow up as directed and return to the ER or see a physician for new or worsening symptoms.  Thank you.
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
## 2014-05-02 ENCOUNTER — Encounter (HOSPITAL_COMMUNITY): Payer: Self-pay | Admitting: Emergency Medicine

## 2014-05-02 ENCOUNTER — Emergency Department (HOSPITAL_COMMUNITY): Payer: BC Managed Care – PPO

## 2014-05-02 ENCOUNTER — Emergency Department (HOSPITAL_COMMUNITY)
Admission: EM | Admit: 2014-05-02 | Discharge: 2014-05-03 | Disposition: A | Discharge status: Home / Self Care | Payer: BC Managed Care – PPO | Attending: Emergency Medicine | Admitting: Emergency Medicine

## 2014-05-02 DIAGNOSIS — M546 Pain in thoracic spine: Secondary | ICD-10-CM | POA: Insufficient documentation

## 2014-05-02 DIAGNOSIS — R42 Dizziness and giddiness: Secondary | ICD-10-CM | POA: Insufficient documentation

## 2014-05-02 DIAGNOSIS — Z87828 Personal history of other (healed) physical injury and trauma: Secondary | ICD-10-CM | POA: Insufficient documentation

## 2014-05-02 DIAGNOSIS — H9202 Otalgia, left ear: Secondary | ICD-10-CM

## 2014-05-02 DIAGNOSIS — M545 Low back pain, unspecified: Secondary | ICD-10-CM | POA: Insufficient documentation

## 2014-05-02 DIAGNOSIS — H538 Other visual disturbances: Secondary | ICD-10-CM | POA: Insufficient documentation

## 2014-05-02 DIAGNOSIS — H9209 Otalgia, unspecified ear: Secondary | ICD-10-CM | POA: Insufficient documentation

## 2014-05-02 DIAGNOSIS — Z8659 Personal history of other mental and behavioral disorders: Secondary | ICD-10-CM | POA: Insufficient documentation

## 2014-05-02 DIAGNOSIS — Z87891 Personal history of nicotine dependence: Secondary | ICD-10-CM | POA: Insufficient documentation

## 2014-05-02 DIAGNOSIS — R51 Headache: Secondary | ICD-10-CM | POA: Insufficient documentation

## 2014-05-02 DIAGNOSIS — Z792 Long term (current) use of antibiotics: Secondary | ICD-10-CM | POA: Insufficient documentation

## 2014-05-02 DIAGNOSIS — M549 Dorsalgia, unspecified: Secondary | ICD-10-CM

## 2014-05-02 LAB — URINALYSIS, ROUTINE W REFLEX MICROSCOPIC
Bilirubin Urine: NEGATIVE
Glucose, UA: NEGATIVE mg/dL
Hgb urine dipstick: NEGATIVE
Ketones, ur: NEGATIVE mg/dL
Leukocytes, UA: NEGATIVE
Nitrite: NEGATIVE
Protein, ur: NEGATIVE mg/dL
Specific Gravity, Urine: 1.012 (ref 1.005–1.030)
Urobilinogen, UA: 0.2 mg/dL (ref 0.0–1.0)
pH: 6 (ref 5.0–8.0)

## 2014-05-02 LAB — COMPREHENSIVE METABOLIC PANEL
ALT: 19 U/L (ref 0–53)
AST: 22 U/L (ref 0–37)
Albumin: 4 g/dL (ref 3.5–5.2)
Alkaline Phosphatase: 44 U/L (ref 39–117)
BUN: 6 mg/dL (ref 6–23)
CO2: 24 mEq/L (ref 19–32)
Calcium: 9.3 mg/dL (ref 8.4–10.5)
Chloride: 105 mEq/L (ref 96–112)
Creatinine, Ser: 0.96 mg/dL (ref 0.50–1.35)
GFR calc Af Amer: >90 mL/min (ref >90)
GFR calc non Af Amer: >90 mL/min (ref >90)
Glucose, Bld: 99 mg/dL (ref 70–99)
Potassium: 3.6 mEq/L — ABNORMAL LOW (ref 3.7–5.3)
Sodium: 141 mEq/L (ref 137–147)
Total Bilirubin: 0.5 mg/dL (ref 0.3–1.2)
Total Protein: 7 g/dL (ref 6.0–8.3)

## 2014-05-02 LAB — RAPID URINE DRUG SCREEN, HOSP PERFORMED
Amphetamines: NOT DETECTED
Barbiturates: NOT DETECTED
Benzodiazepines: NOT DETECTED
Cocaine: NOT DETECTED
Opiates: NOT DETECTED
Tetrahydrocannabinol: POSITIVE — AB

## 2014-05-02 LAB — CBC WITH DIFFERENTIAL/PLATELET
Basophils Absolute: 0 10*3/uL (ref 0.0–0.1)
Basophils Relative: 1 % (ref 0–1)
Eosinophils Absolute: 0.1 10*3/uL (ref 0.0–0.7)
Eosinophils Relative: 3 % (ref 0–5)
HCT: 38.8 % — ABNORMAL LOW (ref 39.0–52.0)
Hemoglobin: 13.1 g/dL (ref 13.0–17.0)
Lymphocytes Relative: 49 % — ABNORMAL HIGH (ref 12–46)
Lymphs Abs: 1.6 10*3/uL (ref 0.7–4.0)
MCH: 31.8 pg (ref 26.0–34.0)
MCHC: 33.8 g/dL (ref 30.0–36.0)
MCV: 94.2 fL (ref 78.0–100.0)
Monocytes Absolute: 0.3 10*3/uL (ref 0.1–1.0)
Monocytes Relative: 9 % (ref 3–12)
Neutro Abs: 1.2 10*3/uL — ABNORMAL LOW (ref 1.7–7.7)
Neutrophils Relative %: 38 % — ABNORMAL LOW (ref 43–77)
Platelets: 187 10*3/uL (ref 150–400)
RBC: 4.12 MIL/uL — ABNORMAL LOW (ref 4.22–5.81)
RDW: 13 % (ref 11.5–15.5)
WBC: 3.1 10*3/uL — ABNORMAL LOW (ref 4.0–10.5)

## 2014-05-02 LAB — TROPONIN I: Troponin I: <0.3 ng/mL (ref <0.30)

## 2014-05-02 MED ORDER — SODIUM CHLORIDE 0.9 % IV BOLUS (SEPSIS)
1000.0000 mL | Freq: Once | INTRAVENOUS | Status: AC
  Administered 2014-05-02: 1000 mL via INTRAVENOUS

## 2014-05-02 NOTE — ED Notes (Signed)
Patient transported to CT 

## 2014-05-02 NOTE — ED Notes (Signed)
Pt ambulated in hallway; pt gait steady; pt states "I feel like I'm on a boat". PA witnessed ambulation

## 2014-05-02 NOTE — ED Notes (Signed)
Pt ambulated in hallway; pt gait steady; pt states he feels like he is dizzy; No Sob noted while ambulating.

## 2014-05-02 NOTE — ED Notes (Signed)
Pt states he started having pounding ear ache one month a go with spells of dizziness and confusion; Pt stated he feel like "air pumped up head"; Family member at bedside has NOT witnessed confused spells pt describes; pt states last episode was today but unknown time length.

## 2014-05-02 NOTE — ED Notes (Signed)
Pt has multiple complaints. Reports throbbing pain to left ear, having headache, dizziness, back pain x 2 weeks. No acute distress noted at triage
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
## 2014-05-03 MED ORDER — HYDROCODONE-ACETAMINOPHEN 5-325 MG PO TABS: 1.0000 | ORAL_TABLET | Freq: Four times a day (QID) | ORAL | Status: DC | PRN

## 2014-05-03 NOTE — Discharge Instructions (Signed)
Please call and set up an appointment with ear, nose, throat physician regarding ongoing ear pain for the past month and a half - will need further assessment by a specialist Please rest and stay hydrated Please avoid any physical or strenuous activity Please take pain medications as prescribed-while on pain medications his be no drinking alcohol, driving, operating any heavy machinery if there is extra please disposer proper manner. Please continue to monitor symptoms closely and if symptoms are to worsen or change (fever greater than 101, chills, chest pain, shortness of breath, difficulty breathing, numbness, tingling, worsening or changes ear pain, complete loss of hearing, facial drooping, numbness, tingling, inability to swallow, neck pain, neck stiffness, blurred vision, sudden loss of vision) please report back to the ED immediately  Otalgia The most common reason for this in children is an infection of the middle ear. Pain from the middle ear is usually caused by a build-up of fluid and pressure behind the eardrum. Pain from an earache can be sharp, dull, or burning. The pain may be temporary or constant. The middle ear is connected to the nasal passages by a short narrow tube called the Eustachian tube. The Eustachian tube allows fluid to drain out of the middle ear, and helps keep the pressure in your ear equalized. CAUSES  A cold or allergy can block the Eustachian tube with inflammation and the build-up of secretions. This is especially likely in small children, because their Eustachian tube is shorter and more horizontal. When the Eustachian tube closes, the normal flow of fluid from the middle ear is stopped. Fluid can accumulate and cause stuffiness, pain, hearing loss, and an ear infection if germs start growing in this area. SYMPTOMS  The symptoms of an ear infection may include fever, ear pain, fussiness, increased crying, and irritability. Many children will have temporary and minor  hearing loss during and right after an ear infection. Permanent hearing loss is rare, but the risk increases the more infections a child has. Other causes of ear pain include retained water in the outer ear canal from swimming and bathing. Ear pain in adults is less likely to be from an ear infection. Ear pain may be referred from other locations. Referred pain may be from the joint between your jaw and the skull. It may also come from a tooth problem or problems in the neck. Other causes of ear pain include:  A foreign body in the ear.  Outer ear infection.  Sinus infections.  Impacted ear wax.  Ear injury.  Arthritis of the jaw or TMJ problems.  Middle ear infection.  Tooth infections.  Sore throat with pain to the ears. DIAGNOSIS  Your caregiver can usually make the diagnosis by examining you. Sometimes other special studies, including x-rays and lab work may be necessary. TREATMENT   If antibiotics were prescribed, use them as directed and finish them even if you or your child's symptoms seem to be improved.  Sometimes PE tubes are needed in children. These are little plastic tubes which are put into the eardrum during a simple surgical procedure. They allow fluid to drain easier and allow the pressure in the middle ear to equalize. This helps relieve the ear pain caused by pressure changes. HOME CARE INSTRUCTIONS   Only take over-the-counter or prescription medicines for pain, discomfort, or fever as directed by your caregiver. DO NOT GIVE CHILDREN ASPIRIN because of the association of Reye's Syndrome in children taking aspirin.  Use a cold pack applied to the  outer ear for 15-20 minutes, 03-04 times per day or as needed may reduce pain. Do not apply ice directly to the skin. You may cause frost bite.  Over-the-counter ear drops used as directed may be effective. Your caregiver may sometimes prescribe ear drops.  Resting in an upright position may help reduce pressure in the  middle ear and relieve pain.  Ear pain caused by rapidly descending from high altitudes can be relieved by swallowing or chewing gum. Allowing infants to suck on a bottle during airplane travel can help.  Do not smoke in the house or near children. If you are unable to quit smoking, smoke outside.  Control allergies. SEEK IMMEDIATE MEDICAL CARE IF:   You or your child are becoming sicker.  Pain or fever relief is not obtained with medicine.  You or your child's symptoms (pain, fever, or irritability) do not improve within 24 to 48 hours or as instructed.  Severe pain suddenly stops hurting. This may indicate a ruptured eardrum.  You or your children develop new problems such as severe headaches, stiff neck, difficulty swallowing, or swelling of the face or around the ear. Document Released: 07/25/2004 Document Revised: 03/01/2012 Document Reviewed: 11/29/2008 [facility] Patient Information 2014 [employer], [place].   Emergency Department Resource Guide 1) Find a Doctor and Pay Out of Pocket Although you won't have to find out who is covered by your insurance plan, it is a good idea to ask around and get recommendations. You will then need to call the office and see if the doctor you have chosen will accept you as a new patient and what types of options they offer for patients who are self-pay. Some doctors offer discounts or will set up payment plans for their patients who do not have insurance, but you will need to ask so you aren't surprised when you get to your appointment.  2) Contact Your Local Health Department Not all health departments have doctors that can see patients for sick visits, but many do, so it is worth a call to see if yours does. If you don't know where your local health department is, you can check in your phone book. The CDC also has a tool to help you locate your state's health department, and many state websites also have listings of all of their local health  departments.  3) Find a Walk-in Clinic If your illness is not likely to be very severe or complicated, you may want to try a walk in clinic. These are popping up all over the country in pharmacies, drugstores, and shopping centers. They're usually staffed by nurse practitioners or physician assistants that have been trained to treat common illnesses and complaints. They're usually fairly quick and inexpensive. However, if you have serious medical issues or chronic medical problems, these are probably not your best option.  No Primary Care Doctor: - Call Health Connect at  [phone] - they can help you locate a primary care doctor that  accepts your insurance, provides certain services, etc. - Physician Referral Service- [phone]  Chronic Pain Problems: Organization         Address  Phone   Notes  [facility]  [phone] Patients need to be referred by their primary care doctor.   Medication Assistance: Organization         Address  Phone   Notes  [facility] [address] [place], [place] [address] [phone]) [phone] --Must be a resident of  [place] -- Must have NO insurance coverage whatsoever (no Medicaid/ Medicare, etc.) -- The pt. MUST have a primary care doctor that directs their care regularly and follows them in the community   MedAssist  [phone]   [employer]  [phone]    Agencies that provide inexpensive medical care: Organization         Address  Phone   Notes  [facility]  [phone]   [facility]    [phone]   [facility] [address] [place], [place] [address] [phone]   [facility] [address] [place] [place] [phone]   Planned Parenthood    [phone]   [facility]    [phone]   [facility]  [address], [place] Phone:  [phone], Fax:  [phone] Hours of Operation:  9 am - 6 pm, M-F.  Also accepts Medicaid/Medicare and self-pay.  [facility]  [address], [place] Phone: [phone], Fax: [phone]. Hours of Operation:  8:30 am - 5:30 pm, M-F.  Also accepts Medicaid and self-pay.  [facility] [place] [address] [place] Phone: [phone]   [facility] [address] Natasha Bence [place], [place] [phone], Ext. [phone] Mondays & Thursdays: 7-9 AM.  First 15 patients are seen on a first come, first serve basis.    Medicaid-accepting [facility] Providers:  Organization         Address  Phone   Notes  [facility] [address], [place] [phone] Also accepts self-pay patients.  [facility] [address] Laurell Josephs [place], [place]  [phone]   [facility] [address], [place] [phone]   [facility] [address] [place] [phone]   Renaye Rakers [address], [place]   [phone] Only accepts [place] Access [place] patients after they have their name applied to their card.   Self-Pay (no insurance) in [facility]:  Organization         Address  Phone   Notes  Sickle Cell Patients, [facility] [address] [place], [place] [phone]   [facility] [address] [place], [place] [phone]   [facility] [place]  [address],  [phone]   [facility]/Dr. Osei-Bonsu  [address] [place] or [address], [place] [phone] Phone number for both [place] and [place] locations is the same.  [facility] [address] [place] [phone]   [facility] [address] [place] or [address] [phone]   [facility] [address] [place] [phone], phone; [phone], fax Sees patients 1st and 3rd Saturday of every month.  Must not qualify for public or private insurance (i.e. Medicaid, Medicare, [place] Health Choice, Veterans' Benefits)  Household income should be no more than 200% of the poverty level The clinic cannot treat you if you are pregnant or think you are pregnant  Sexually transmitted diseases are not treated at the clinic.    Dental Care: Organization         Address  Phone  Notes  [facility] [address], [place] [phone] Accepts children up to age 35 who are enrolled in [place] or [place] Health Choice; pregnant women with a Medicaid card; and children who have applied for Medicaid or [place] Health Choice, but were declined, whose parents can pay a reduced fee at time of service.  [facility] [place]  [address], [place] [phone] Accepts children up to age 35 who are enrolled in [place] or [place] Health Choice; pregnant women with a Medicaid card; and children who have applied for Medicaid or [place] Health Choice, but were declined, whose parents can pay a reduced fee at time of service.  [facility]  [address], [place] [phone] Patients are seen by appointment only. Walk-ins are not accepted. [facility] will see patients 35 years of age and older. Monday - Tuesday (8am-5pm) Most Wednesdays (8:30-5pm) $30 per visit, cash only  [facility]  [address], [facility] [phone] Patients are seen by appointment only. Walk-ins are not accepted. [facility] will see patients 35 years of age and older. One Wednesday Evening (Monthly: Volunteer Based).  $30 per visit, cash only  [employer] [facility]  [phone]) [phone] for adults;  Children under age 654, call Graduate Pediatric Dentistry at [phone]. Children aged 334-14, please call [phone] to request a pediatric application.  Dental services are provided in all areas of dental care including fillings, crowns and bridges, complete and partial dentures, implants, gum treatment, root canals, and extractions. Preventive care is also provided. Treatment is provided to both adults and children. Patients are selected via a lottery and there is often a waiting list.   [facility] [address], [place]  [phone]) [phone] [email or web address]   [facility] [address], [place], [place] [phone], Ext. [phone] Second and Fourth Thursday of each month, opens at 6:30 AM; Clinic ends at 9 AM.  Patients are seen on a first-come first-served basis, and a limited number are seen during each clinic.   [facility]  [address], [place], [place] [phone]) [phone]   Eligibility Requirements You must have lived in [place], [place], or [place] counties for at least the last three months.   You cannot be eligible for state or federal sponsored National Cityhealthcare insurance, including CIGNAVeterans Administration, [place]icaid, or [employer] EntertainmentMedicare.   You generally cannot be eligible for healthcare insurance through your employer.    How to apply: Eligibility screenings are held every Tuesday and Wednesday afternoon from 1:00 pm until 4:00 pm. You do not need an appointment for the interview!  [facility] [address], [place], [place] [phone]   [facility]  [phone]   [facility]  [phone]   [facility]  [phone]    Behavioral Health Resources in the Community: Intensive Outpatient Programs Organization         Address  Phone  Notes  [facility] [address], [place], [place] [phone]   [facility] [address], [place], [place] [phone]   [facility] [address], [place], [place]  [phone]   [facility] [address],  [place], [place] [phone] or [phone]   Substance Abuse Resources Organization  Address  Phone  Notes  Alcohol and Drug Services  [phone]   [place]  [phone]   [facility] [place]  [phone]   Chinita Pester  [phone]   Residential & Outpatient Substance Abuse Program  [phone]   Psychological Services Organization         Address  Phone  Notes  [facility] [place]  [place]  [phone]   [place] [address], [place] or [phone]    Mobile Crisis Teams Organization         Address  Phone  Notes  [facility], Mobile Crisis Care Unit  [phone]   [facility]  [address] [place], [place]   [place] Levels [address], [place] [phone]    Self-Help/Support Groups Organization         Address  Phone             Notes  [place]. of [place] - variety of support groups  [place] Call for more information  Narcotics Anonymous (NA), [facility] [address], [employer] [place]  2 meetings at this location   Special educational needs teacher         Address  Phone  Notes  [facility] [place],    [place]  [phone]   [facility] [place]  [address], [place] [id number], [place], [place]   [place], [place] [phone] Admissions: 8am-3pm M-F  Incentives Substance [place] [address]    [place], [place] [phone]   [facility] [address] [place], [place], [place]   [facility] [address]  [place], [place]   [facility]ight Programs - Intensive  [address] Kristeen Mans [address], [place], [place]   [facility] [place].,  [place], [place] [phone] or [phone]   [facility] (RTS) [address], [place], [place] Accepts Medicaid  [facility] [address],  [place] [phone] Substance Abuse/Addiction Treatment   [facility] Organization         Address  Phone  Notes  [facility]  [phone]   Domenic Schwab, PhD [address] Arlis Porta [place], [place]   [phone] or [phone]   [place], [place] [phone]   [facility] [phone], [place], [place] [phone] Insurance/Medicaid/sponsorship through [facility] and Families [address]                                    [place], [place] [phone] [place] [address], [place] [phone]    Dr. Adele Schilder  [phone]   [facility] Dept. 1) [address] [place] 2) [place] 3)  [place] [address], [place] [phone]  [phone]   [place] [phone] or [phone] (After Hours)

## 2014-05-03 NOTE — ED Provider Notes (Signed)
Medical screening examination/treatment/procedure(s) were performed by non-physician practitioner and as supervising physician I was immediately available for consultation/collaboration.   EKG Interpretation None        Dheeraj Hail B. Delena Casebeer, MD 05/03/14 1247 

## 2015-02-28 ENCOUNTER — Encounter (HOSPITAL_COMMUNITY): Payer: Self-pay | Admitting: Emergency Medicine

## 2015-02-28 ENCOUNTER — Emergency Department (HOSPITAL_COMMUNITY): Payer: BLUE CROSS/BLUE SHIELD

## 2015-02-28 ENCOUNTER — Emergency Department (HOSPITAL_COMMUNITY)
Admission: EM | Admit: 2015-02-28 | Discharge: 2015-02-28 | Disposition: A | Discharge status: Home / Self Care | Payer: BLUE CROSS/BLUE SHIELD | Attending: Emergency Medicine | Admitting: Emergency Medicine

## 2015-02-28 DIAGNOSIS — Z792 Long term (current) use of antibiotics: Secondary | ICD-10-CM | POA: Diagnosis not present

## 2015-02-28 DIAGNOSIS — Z87828 Personal history of other (healed) physical injury and trauma: Secondary | ICD-10-CM | POA: Insufficient documentation

## 2015-02-28 DIAGNOSIS — Z720 Tobacco use: Secondary | ICD-10-CM | POA: Insufficient documentation

## 2015-02-28 DIAGNOSIS — Z79899 Other long term (current) drug therapy: Secondary | ICD-10-CM | POA: Insufficient documentation

## 2015-02-28 DIAGNOSIS — R05 Cough: Secondary | ICD-10-CM | POA: Diagnosis not present

## 2015-02-28 DIAGNOSIS — F410 Panic disorder [episodic paroxysmal anxiety] without agoraphobia: Secondary | ICD-10-CM | POA: Diagnosis not present

## 2015-02-28 DIAGNOSIS — E669 Obesity, unspecified: Secondary | ICD-10-CM | POA: Insufficient documentation

## 2015-02-28 DIAGNOSIS — R058 Other specified cough: Secondary | ICD-10-CM

## 2015-02-28 MED ORDER — ALBUTEROL SULFATE HFA 108 (90 BASE) MCG/ACT IN AERS
2.0000 | INHALATION_SPRAY | Freq: Once | RESPIRATORY_TRACT | Status: AC
  Administered 2015-02-28: 2 via RESPIRATORY_TRACT
  Filled 2015-02-28: qty 6.7

## 2015-02-28 MED ORDER — ALBUTEROL SULFATE HFA 108 (90 BASE) MCG/ACT IN AERS: 2.0000 | INHALATION_SPRAY | RESPIRATORY_TRACT | Status: DC | PRN

## 2015-02-28 MED ORDER — BENZONATATE 100 MG PO CAPS: 100.0000 mg | ORAL_CAPSULE | Freq: Three times a day (TID) | ORAL | Status: DC
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
## 2015-02-28 NOTE — ED Notes (Addendum)
Pt c/o dry cough x 2 weeks.  Pt states that he believes he has PNA or something.  Pt adds that he has had fevers and has gone through 2 bottles of OTC flu meds.  Now he adds that he does cough up mucous, in mornings it's yellow and evenings its white.

## 2015-02-28 NOTE — Discharge Instructions (Signed)
Upper Respiratory Infection, Adult An upper respiratory infection (URI) is also sometimes known as the common cold. The upper respiratory tract includes the nose, sinuses, throat, trachea, and bronchi. Bronchi are the airways leading to the lungs. Most people improve within 1 week, but symptoms can last up to 2 weeks. A residual cough may last even longer.  CAUSES Many different viruses can infect the tissues lining the upper respiratory tract. The tissues become irritated and inflamed and often become very moist. Mucus production is also common. A cold is contagious. You can easily spread the virus to others by oral contact. This includes kissing, sharing a glass, coughing, or sneezing. Touching your mouth or nose and then touching a surface, which is then touched by another person, can also spread the virus. SYMPTOMS  Symptoms typically develop 1 to 3 days after you come in contact with a cold virus. Symptoms vary from person to person. They may include:  Runny nose.  Sneezing.  Nasal congestion.  Sinus irritation.  Sore throat.  Loss of voice (laryngitis).  Cough.  Fatigue.  Muscle aches.  Loss of appetite.  Headache.  Low-grade fever. DIAGNOSIS  You might diagnose your own cold based on familiar symptoms, since most people get a cold 2 to 3 times a year. Your caregiver can confirm this based on your exam. Most importantly, your caregiver can check that your symptoms are not due to another disease such as strep throat, sinusitis, pneumonia, asthma, or epiglottitis. Blood tests, throat tests, and X-rays are not necessary to diagnose a common cold, but they may sometimes be helpful in excluding other more serious diseases. Your caregiver will decide if any further tests are required. RISKS AND COMPLICATIONS  You may be at risk for a more severe case of the common cold if you smoke cigarettes, have chronic heart disease (such as heart failure) or lung disease (such as asthma), or if  you have a weakened immune system. The very young and very old are also at risk for more serious infections. Bacterial sinusitis, middle ear infections, and bacterial pneumonia can complicate the common cold. The common cold can worsen asthma and chronic obstructive pulmonary disease (COPD). Sometimes, these complications can require emergency medical care and may be life-threatening. PREVENTION  The best way to protect against getting a cold is to practice good hygiene. Avoid oral or hand contact with people with cold symptoms. Wash your hands often if contact occurs. There is no clear evidence that vitamin C, vitamin E, echinacea, or exercise reduces the chance of developing a cold. However, it is always recommended to get plenty of rest and practice good nutrition. TREATMENT  Treatment is directed at relieving symptoms. There is no cure. Antibiotics are not effective, because the infection is caused by a virus, not by bacteria. Treatment may include:  Increased fluid intake. Sports drinks offer valuable electrolytes, sugars, and fluids.  Breathing heated mist or steam (vaporizer or shower).  Eating chicken soup or other clear broths, and maintaining good nutrition.  Getting plenty of rest.  Using gargles or lozenges for comfort.  Controlling fevers with ibuprofen or acetaminophen as directed by your caregiver.  Increasing usage of your inhaler if you have asthma. Zinc gel and zinc lozenges, taken in the first 24 hours of the common cold, can shorten the duration and lessen the severity of symptoms. Pain medicines may help with fever, muscle aches, and throat pain. A variety of non-prescription medicines are available to treat congestion and runny nose. Your caregiver   can make recommendations and may suggest nasal or lung inhalers for other symptoms.  HOME CARE INSTRUCTIONS   Only take over-the-counter or prescription medicines for pain, discomfort, or fever as directed by your  caregiver.  Use a warm mist humidifier or inhale steam from a shower to increase air moisture. This may keep secretions moist and make it easier to breathe.  Drink enough water and fluids to keep your urine clear or pale yellow.  Rest as needed.  Return to work when your temperature has returned to normal or as your caregiver advises. You may need to stay home longer to avoid infecting others. You can also use a face mask and careful hand washing to prevent spread of the virus. SEEK MEDICAL CARE IF:   After the first few days, you feel you are getting worse rather than better.  You need your caregiver's advice about medicines to control symptoms.  You develop chills, worsening shortness of breath, or brown or red sputum. These may be signs of pneumonia.  You develop yellow or brown nasal discharge or pain in the face, especially when you bend forward. These may be signs of sinusitis.  You develop a fever, swollen neck glands, pain with swallowing, or white areas in the back of your throat. These may be signs of strep throat. SEEK IMMEDIATE MEDICAL CARE IF:   You have a fever.  You develop severe or persistent headache, ear pain, sinus pain, or chest pain.  You develop wheezing, a prolonged cough, cough up blood, or have a change in your usual mucus (if you have chronic lung disease).  You develop sore muscles or a stiff neck. Document Released: 06/03/2001 Document Revised: 03/01/2012 Document Reviewed: 03/15/2014 Exit[employer] Patient Information 2015 [employer]. This information is not intended to replace advice given to you by your health care provider. Make sure you discuss any questions you have with your health care provider.  

## 2015-02-28 NOTE — ED Notes (Signed)
Pt aware awaiting DG technician to complete DG.

## 2015-02-28 NOTE — ED Notes (Signed)
Pt entering room at present time; ambulatory to room; NAD. Eliezer Loftsarrie Hall at bedside triaging pt.

## 2015-11-04 ENCOUNTER — Emergency Department (HOSPITAL_COMMUNITY)
Admission: EM | Admit: 2015-11-04 | Discharge: 2015-11-04 | Disposition: A | Discharge status: Home / Self Care | Payer: No Typology Code available for payment source | Attending: Emergency Medicine | Admitting: Emergency Medicine

## 2015-11-04 ENCOUNTER — Encounter (HOSPITAL_COMMUNITY): Payer: Self-pay | Admitting: Emergency Medicine

## 2015-11-04 DIAGNOSIS — S4991XA Unspecified injury of right shoulder and upper arm, initial encounter: Secondary | ICD-10-CM | POA: Insufficient documentation

## 2015-11-04 DIAGNOSIS — Y998 Other external cause status: Secondary | ICD-10-CM | POA: Insufficient documentation

## 2015-11-04 DIAGNOSIS — S59902A Unspecified injury of left elbow, initial encounter: Secondary | ICD-10-CM | POA: Diagnosis not present

## 2015-11-04 DIAGNOSIS — Y9389 Activity, other specified: Secondary | ICD-10-CM | POA: Diagnosis not present

## 2015-11-04 DIAGNOSIS — Z792 Long term (current) use of antibiotics: Secondary | ICD-10-CM | POA: Diagnosis not present

## 2015-11-04 DIAGNOSIS — Z79899 Other long term (current) drug therapy: Secondary | ICD-10-CM | POA: Diagnosis not present

## 2015-11-04 DIAGNOSIS — F17210 Nicotine dependence, cigarettes, uncomplicated: Secondary | ICD-10-CM | POA: Diagnosis not present

## 2015-11-04 DIAGNOSIS — Y92410 Unspecified street and highway as the place of occurrence of the external cause: Secondary | ICD-10-CM | POA: Diagnosis not present

## 2015-11-04 DIAGNOSIS — M542 Cervicalgia: Secondary | ICD-10-CM

## 2015-11-04 DIAGNOSIS — S199XXA Unspecified injury of neck, initial encounter: Secondary | ICD-10-CM | POA: Diagnosis not present

## 2015-11-04 DIAGNOSIS — Z8659 Personal history of other mental and behavioral disorders: Secondary | ICD-10-CM | POA: Diagnosis not present

## 2015-11-04 DIAGNOSIS — M25522 Pain in left elbow: Secondary | ICD-10-CM

## 2015-11-04 MED ORDER — OXYCODONE-ACETAMINOPHEN 5-325 MG PO TABS
2.0000 | ORAL_TABLET | Freq: Once | ORAL | Status: AC
  Administered 2015-11-04: 2 via ORAL
  Filled 2015-11-04: qty 2

## 2015-11-04 MED ORDER — CYCLOBENZAPRINE HCL 10 MG PO TABS: 10.0000 mg | ORAL_TABLET | Freq: Once | ORAL | Status: DC

## 2015-11-04 MED ORDER — DIAZEPAM 5 MG PO TABS: 5.0000 mg | ORAL_TABLET | Freq: Two times a day (BID) | ORAL | Status: DC | PRN

## 2015-11-04 MED ORDER — CYCLOBENZAPRINE HCL 10 MG PO TABS: 10.0000 mg | ORAL_TABLET | Freq: Two times a day (BID) | ORAL | Status: DC | PRN

## 2015-11-04 MED ORDER — DIAZEPAM 5 MG PO TABS
5.0000 mg | ORAL_TABLET | Freq: Once | ORAL | Status: AC
  Administered 2015-11-04: 5 mg via ORAL
  Filled 2015-11-04: qty 1
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
## 2016-09-15 ENCOUNTER — Emergency Department (HOSPITAL_COMMUNITY)
Admission: EM | Admit: 2016-09-15 | Discharge: 2016-09-15 | Disposition: A | Discharge status: Home / Self Care | Payer: Self-pay | Attending: Emergency Medicine | Admitting: Emergency Medicine

## 2016-09-15 ENCOUNTER — Encounter (HOSPITAL_COMMUNITY): Payer: Self-pay | Admitting: Emergency Medicine

## 2016-09-15 ENCOUNTER — Emergency Department (HOSPITAL_COMMUNITY): Payer: Self-pay

## 2016-09-15 DIAGNOSIS — J069 Acute upper respiratory infection, unspecified: Secondary | ICD-10-CM | POA: Insufficient documentation

## 2016-09-15 DIAGNOSIS — Z79899 Other long term (current) drug therapy: Secondary | ICD-10-CM | POA: Insufficient documentation

## 2016-09-15 DIAGNOSIS — R112 Nausea with vomiting, unspecified: Secondary | ICD-10-CM | POA: Insufficient documentation

## 2016-09-15 DIAGNOSIS — R197 Diarrhea, unspecified: Secondary | ICD-10-CM | POA: Insufficient documentation

## 2016-09-15 DIAGNOSIS — F17210 Nicotine dependence, cigarettes, uncomplicated: Secondary | ICD-10-CM | POA: Insufficient documentation

## 2016-09-15 DIAGNOSIS — I10 Essential (primary) hypertension: Secondary | ICD-10-CM | POA: Insufficient documentation

## 2016-09-15 HISTORY — DX: Essential (primary) hypertension: I10

## 2016-09-15 LAB — URINALYSIS, ROUTINE W REFLEX MICROSCOPIC
Bilirubin Urine: NEGATIVE
Glucose, UA: NEGATIVE mg/dL
Hgb urine dipstick: NEGATIVE
Ketones, ur: 15 mg/dL — AB
Leukocytes, UA: NEGATIVE
Nitrite: NEGATIVE
Protein, ur: NEGATIVE mg/dL
Specific Gravity, Urine: 1.029 (ref 1.005–1.030)
pH: 5.5 (ref 5.0–8.0)

## 2016-09-15 LAB — COMPREHENSIVE METABOLIC PANEL
ALT: 24 U/L (ref 17–63)
AST: 24 U/L (ref 15–41)
Albumin: 4.8 g/dL (ref 3.5–5.0)
Alkaline Phosphatase: 46 U/L (ref 38–126)
Anion gap: 10 (ref 5–15)
BUN: 15 mg/dL (ref 6–20)
CO2: 26 mmol/L (ref 22–32)
Calcium: 9.8 mg/dL (ref 8.9–10.3)
Chloride: 101 mmol/L (ref 101–111)
Creatinine, Ser: 1.25 mg/dL — ABNORMAL HIGH (ref 0.61–1.24)
GFR calc Af Amer: >60 mL/min (ref >60)
GFR calc non Af Amer: >60 mL/min (ref >60)
Glucose, Bld: 86 mg/dL (ref 65–99)
Potassium: 3.8 mmol/L (ref 3.5–5.1)
Sodium: 137 mmol/L (ref 135–145)
Total Bilirubin: 0.5 mg/dL (ref 0.3–1.2)
Total Protein: 8.1 g/dL (ref 6.5–8.1)

## 2016-09-15 LAB — CBC
HCT: 45 % (ref 39.0–52.0)
Hemoglobin: 15 g/dL (ref 13.0–17.0)
MCH: 31.8 pg (ref 26.0–34.0)
MCHC: 33.3 g/dL (ref 30.0–36.0)
MCV: 95.5 fL (ref 78.0–100.0)
Platelets: 234 10*3/uL (ref 150–400)
RBC: 4.71 MIL/uL (ref 4.22–5.81)
RDW: 12.9 % (ref 11.5–15.5)
WBC: 4.2 10*3/uL (ref 4.0–10.5)

## 2016-09-15 LAB — LIPASE, BLOOD: Lipase: 19 U/L (ref 11–51)

## 2016-09-15 MED ORDER — PROMETHAZINE HCL 25 MG PO TABS: 25.0000 mg | ORAL_TABLET | Freq: Four times a day (QID) | ORAL | 0 refills | Status: DC | PRN

## 2016-09-15 MED ORDER — SODIUM CHLORIDE 0.9 % IV BOLUS (SEPSIS)
1000.0000 mL | Freq: Once | INTRAVENOUS | Status: AC
  Administered 2016-09-15: 1000 mL via INTRAVENOUS

## 2016-09-15 MED ORDER — LOPERAMIDE HCL 2 MG PO CAPS: 2.0000 mg | ORAL_CAPSULE | Freq: Four times a day (QID) | ORAL | 0 refills | Status: DC | PRN

## 2016-09-15 MED ORDER — ONDANSETRON HCL 4 MG/2ML IJ SOLN
4.0000 mg | Freq: Once | INTRAMUSCULAR | Status: AC
  Administered 2016-09-15: 4 mg via INTRAVENOUS
  Filled 2016-09-15: qty 2

## 2016-09-15 MED ORDER — NAPROXEN 500 MG PO TABS
500.0000 mg | ORAL_TABLET | Freq: Two times a day (BID) | ORAL | 0 refills | Status: DC
Start: 2016-09-15 — End: 2017-02-01

## 2016-09-15 NOTE — ED Triage Notes (Signed)
patietn c/o dizziness, sob, cough with brown phlegm, eyes burning and sinus/cold symptoms x week.  Patient c/o n/v/d that started today.  State vomited 5-45 times and lost count on how many times had diarrhea this morning,
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
## 2017-02-01 ENCOUNTER — Emergency Department (HOSPITAL_COMMUNITY): Payer: BLUE CROSS/BLUE SHIELD

## 2017-02-01 ENCOUNTER — Emergency Department (HOSPITAL_COMMUNITY)
Admission: EM | Admit: 2017-02-01 | Discharge: 2017-02-01 | Disposition: A | Discharge status: Home / Self Care | Payer: BLUE CROSS/BLUE SHIELD | Attending: Emergency Medicine | Admitting: Emergency Medicine

## 2017-02-01 ENCOUNTER — Encounter (HOSPITAL_COMMUNITY): Payer: Self-pay | Admitting: Emergency Medicine

## 2017-02-01 DIAGNOSIS — Y9289 Other specified places as the place of occurrence of the external cause: Secondary | ICD-10-CM | POA: Insufficient documentation

## 2017-02-01 DIAGNOSIS — S300XXA Contusion of lower back and pelvis, initial encounter: Secondary | ICD-10-CM | POA: Insufficient documentation

## 2017-02-01 DIAGNOSIS — Z91010 Allergy to peanuts: Secondary | ICD-10-CM | POA: Insufficient documentation

## 2017-02-01 DIAGNOSIS — F17210 Nicotine dependence, cigarettes, uncomplicated: Secondary | ICD-10-CM | POA: Diagnosis not present

## 2017-02-01 DIAGNOSIS — Z79899 Other long term (current) drug therapy: Secondary | ICD-10-CM | POA: Insufficient documentation

## 2017-02-01 DIAGNOSIS — X58XXXA Exposure to other specified factors, initial encounter: Secondary | ICD-10-CM | POA: Diagnosis not present

## 2017-02-01 DIAGNOSIS — R55 Syncope and collapse: Secondary | ICD-10-CM | POA: Insufficient documentation

## 2017-02-01 DIAGNOSIS — Y939 Activity, unspecified: Secondary | ICD-10-CM | POA: Diagnosis not present

## 2017-02-01 DIAGNOSIS — I10 Essential (primary) hypertension: Secondary | ICD-10-CM | POA: Insufficient documentation

## 2017-02-01 DIAGNOSIS — Y990 Civilian activity done for income or pay: Secondary | ICD-10-CM | POA: Diagnosis not present

## 2017-02-01 DIAGNOSIS — S3992XA Unspecified injury of lower back, initial encounter: Secondary | ICD-10-CM | POA: Diagnosis present

## 2017-02-01 LAB — RAPID URINE DRUG SCREEN, HOSP PERFORMED
Amphetamines: NOT DETECTED
Barbiturates: NOT DETECTED
Benzodiazepines: POSITIVE — AB
Cocaine: POSITIVE — AB
Opiates: NOT DETECTED
Tetrahydrocannabinol: POSITIVE — AB

## 2017-02-01 LAB — URINALYSIS, ROUTINE W REFLEX MICROSCOPIC
Bacteria, UA: NONE SEEN
Bilirubin Urine: NEGATIVE
Glucose, UA: NEGATIVE mg/dL
Hgb urine dipstick: NEGATIVE
Ketones, ur: NEGATIVE mg/dL
Nitrite: NEGATIVE
Protein, ur: NEGATIVE mg/dL
Specific Gravity, Urine: 1.015 (ref 1.005–1.030)
Squamous Epithelial / LPF: NONE SEEN
pH: 6 (ref 5.0–8.0)

## 2017-02-01 LAB — BASIC METABOLIC PANEL
Anion gap: 6 (ref 5–15)
BUN: 12 mg/dL (ref 6–20)
CO2: 29 mmol/L (ref 22–32)
Calcium: 9.1 mg/dL (ref 8.9–10.3)
Chloride: 104 mmol/L (ref 101–111)
Creatinine, Ser: 1.24 mg/dL (ref 0.61–1.24)
GFR calc Af Amer: >60 mL/min (ref >60)
GFR calc non Af Amer: >60 mL/min (ref >60)
Glucose, Bld: 103 mg/dL — ABNORMAL HIGH (ref 65–99)
Potassium: 4.1 mmol/L (ref 3.5–5.1)
Sodium: 139 mmol/L (ref 135–145)

## 2017-02-01 LAB — CBC
HCT: 39.2 % (ref 39.0–52.0)
Hemoglobin: 13.2 g/dL (ref 13.0–17.0)
MCH: 32.4 pg (ref 26.0–34.0)
MCHC: 33.7 g/dL (ref 30.0–36.0)
MCV: 96.1 fL (ref 78.0–100.0)
Platelets: 183 10*3/uL (ref 150–400)
RBC: 4.08 MIL/uL — ABNORMAL LOW (ref 4.22–5.81)
RDW: 12.6 % (ref 11.5–15.5)
WBC: 3.2 10*3/uL — ABNORMAL LOW (ref 4.0–10.5)

## 2017-02-01 LAB — ETHANOL: Alcohol, Ethyl (B): <5 mg/dL (ref <5)

## 2017-02-01 LAB — CBG MONITORING, ED: Glucose-Capillary: 109 mg/dL — ABNORMAL HIGH (ref 65–99)

## 2017-02-01 MED ORDER — SODIUM CHLORIDE 0.9 % IV BOLUS (SEPSIS)
1000.0000 mL | Freq: Once | INTRAVENOUS | Status: AC
  Administered 2017-02-01: 1000 mL via INTRAVENOUS

## 2017-02-01 NOTE — ED Notes (Signed)
Pt cannot use restroom at this time, aware urine specimen is needed. Urinal Provided  

## 2017-02-01 NOTE — Discharge Instructions (Signed)
Please avoid cocaine, or marijuana use as it may contribute to your current condition.  Stay hydrated and follow up with a primary care provider for further management of your health.

## 2017-02-01 NOTE — ED Notes (Signed)
Unable to collect labs at this time PA at bedside  
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
## 2017-02-01 NOTE — ED Triage Notes (Signed)
Patient states he "got confused this morning at work then passed out." Patient alert and oriented at this time.

## 2018-01-10 IMAGING — CT CT HEAD W/O CM
3 of 4 series · 15 of 47 positions shown, 18 images · non-contrast
Comparison: 05/02/2014

CLINICAL DATA: Patient states he "got confused this morning at work
then passed out." Patient alert and oriented at this time.

EXAM:
CT HEAD WITHOUT CONTRAST
TECHNIQUE: Contiguous axial images were obtained from the base of the skull
through the vertex without intravenous contrast.

[Series 2: head w/o · axial · non-contrast · 0.45mm/px · z∈[-143,-13]mm · 9 of 32 slices shown, 12 images]
[im 3/32  brain]
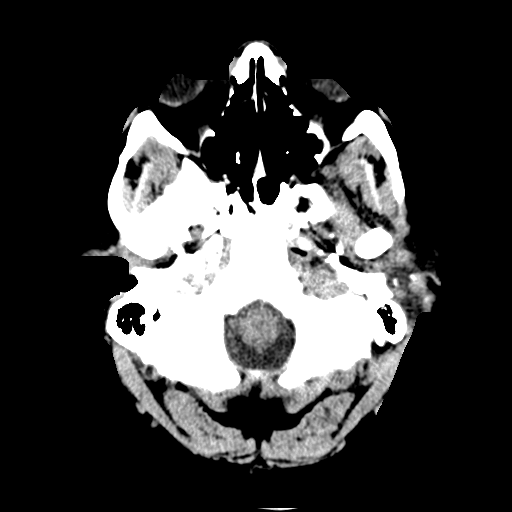
[im 3/32  bone]
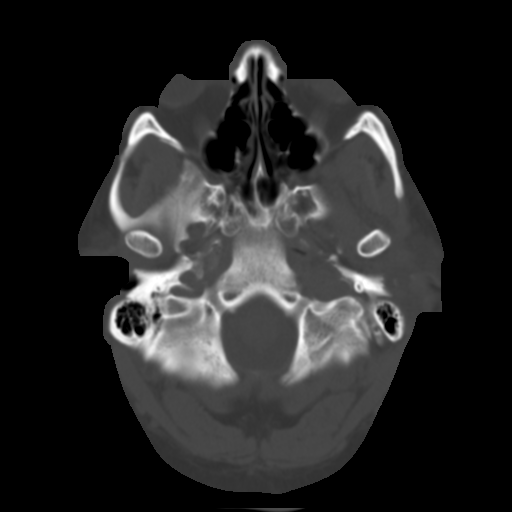
[im 7/32  brain]
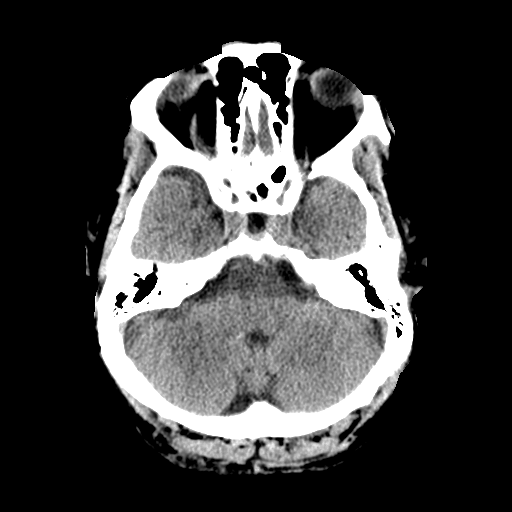
[im 9/32  brain]
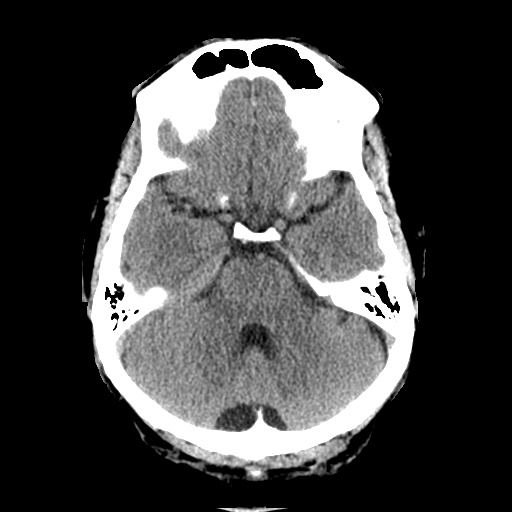
[im 14/32  brain]
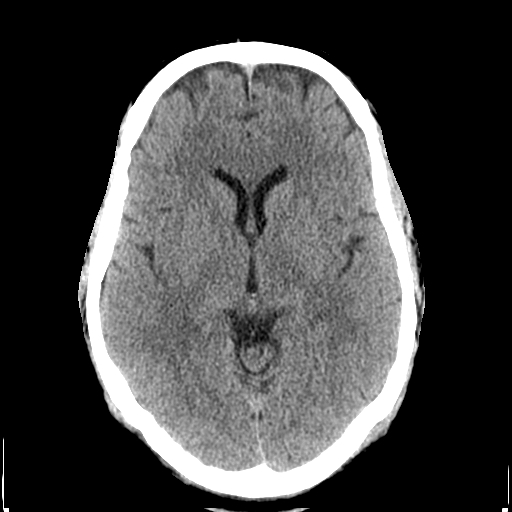
[im 16/32  brain]
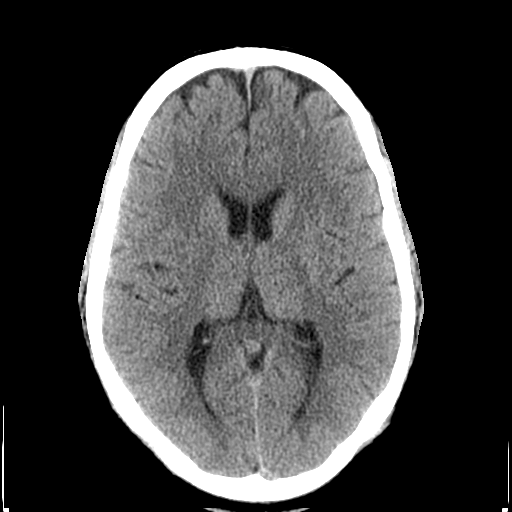
[im 16/32  bone]
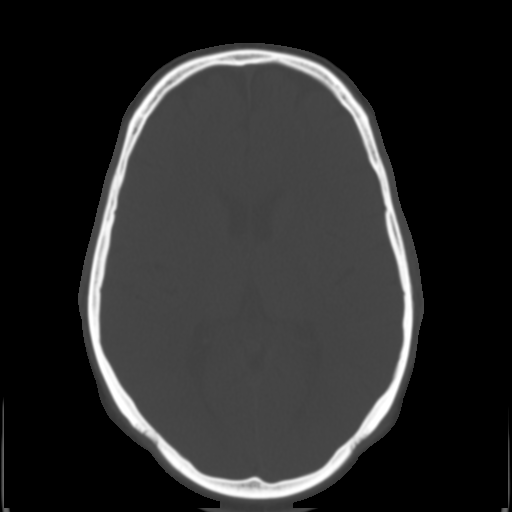
[im 18/32  brain]
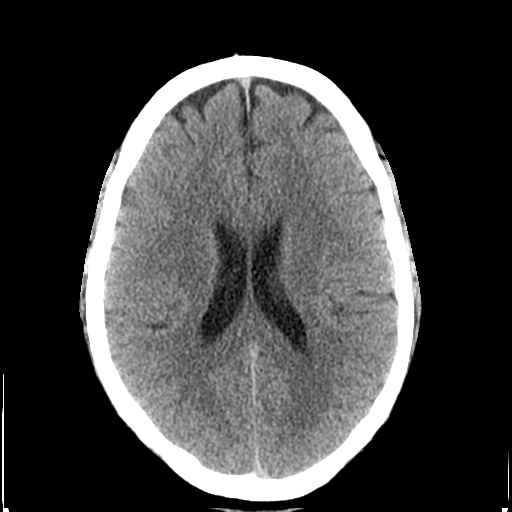
[im 23/32  brain]
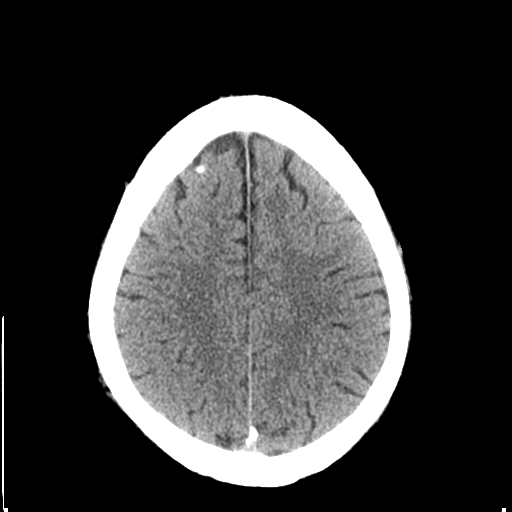
[im 25/32  brain]
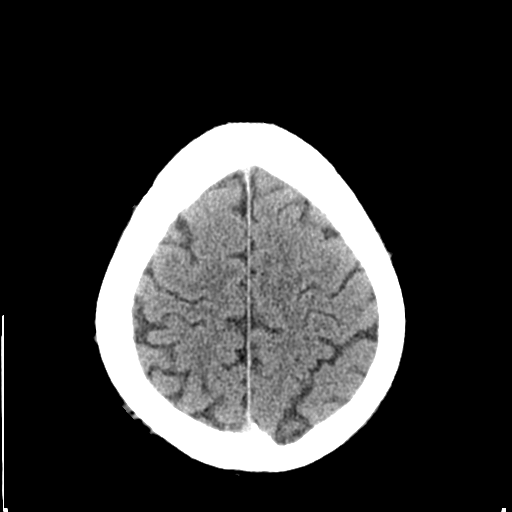
[im 29/32  brain]
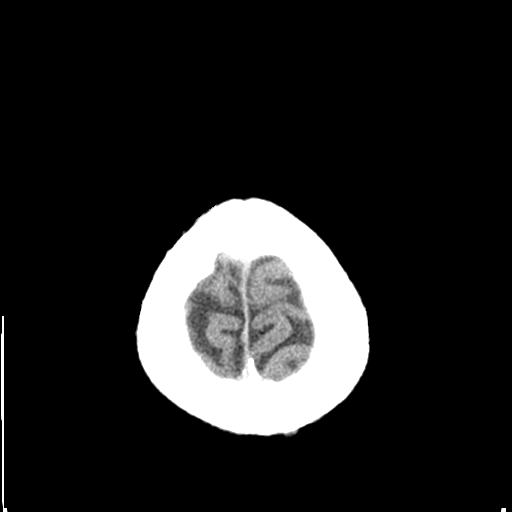
[im 29/32  bone]
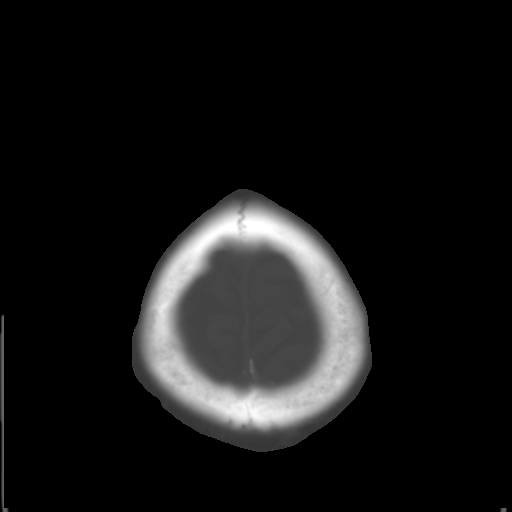

[Series 5: coronal · coronal · 0.31mm/px · 3 of 70 slices shown]
[im 24/70  brain]
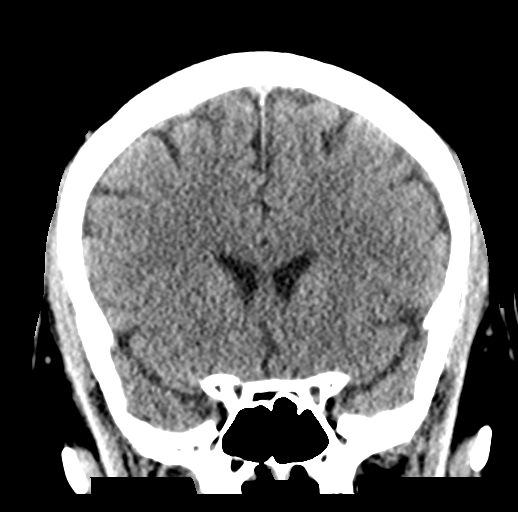
[im 31/70  brain]
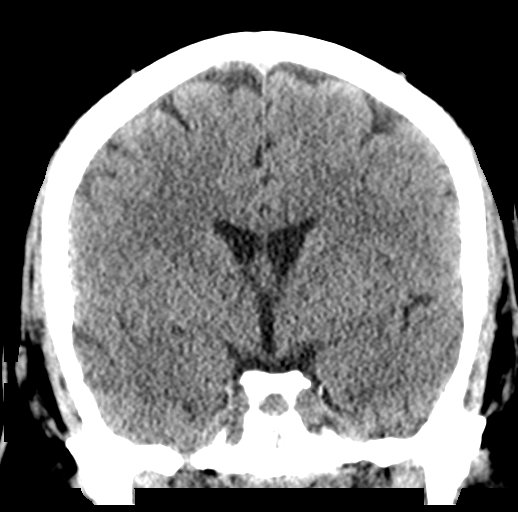
[im 39/70  brain]
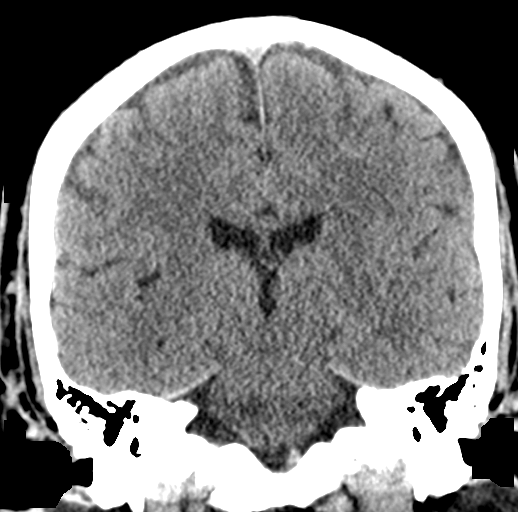

[Series 6: sagittal · sagittal · 0.33mm/px · 3 of 51 slices shown]
[im 17/51  brain]
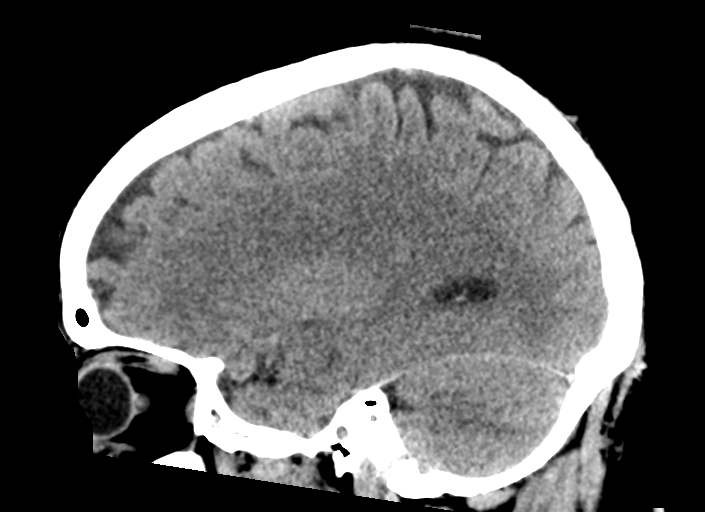
[im 26/51  brain]
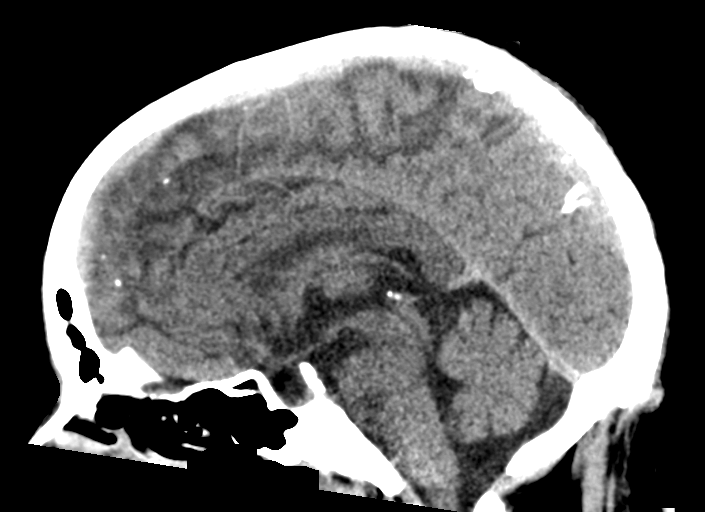
[im 34/51  brain]
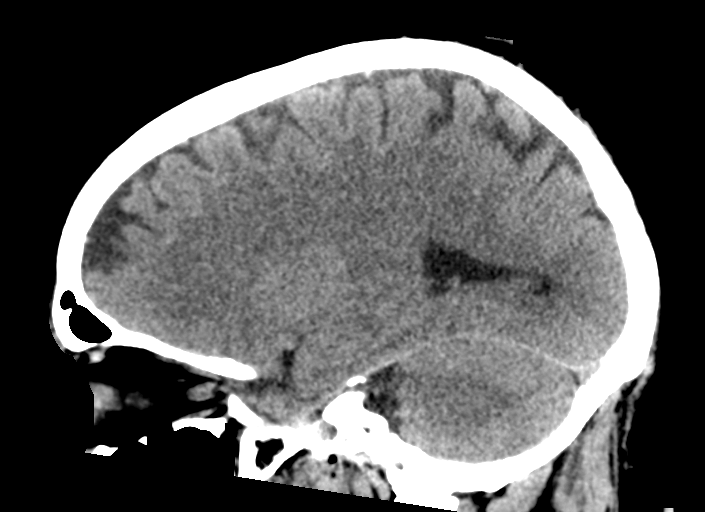

[15 of 47 positions shown; findings below may reference images not displayed]

FINDINGS: Brain: No evidence of acute infarction, hemorrhage, hydrocephalus,
extra-axial collection or mass lesion/mass effect.

Vascular: No hyperdense vessel or unexpected calcification.

Skull: No osseous abnormality.

Sinuses/Orbits: Visualized paranasal sinuses are clear. Visualized
mastoid sinuses are clear. Visualized orbits demonstrate no focal
abnormality.

Other: None
IMPRESSION: No acute intracranial pathology.

## 2018-07-09 ENCOUNTER — Encounter (HOSPITAL_COMMUNITY): Payer: Self-pay | Admitting: Emergency Medicine

## 2018-07-09 ENCOUNTER — Emergency Department (HOSPITAL_COMMUNITY)
Admission: EM | Admit: 2018-07-09 | Discharge: 2018-07-09 | Disposition: A | Discharge status: Home / Self Care | Payer: BLUE CROSS/BLUE SHIELD | Attending: Emergency Medicine | Admitting: Emergency Medicine

## 2018-07-09 DIAGNOSIS — F17210 Nicotine dependence, cigarettes, uncomplicated: Secondary | ICD-10-CM | POA: Insufficient documentation

## 2018-07-09 DIAGNOSIS — Z91010 Allergy to peanuts: Secondary | ICD-10-CM | POA: Insufficient documentation

## 2018-07-09 DIAGNOSIS — I10 Essential (primary) hypertension: Secondary | ICD-10-CM | POA: Insufficient documentation

## 2018-07-09 DIAGNOSIS — H9202 Otalgia, left ear: Secondary | ICD-10-CM | POA: Insufficient documentation

## 2018-07-09 DIAGNOSIS — Z79899 Other long term (current) drug therapy: Secondary | ICD-10-CM | POA: Insufficient documentation

## 2018-07-09 DIAGNOSIS — K0889 Other specified disorders of teeth and supporting structures: Secondary | ICD-10-CM

## 2018-07-09 DIAGNOSIS — F419 Anxiety disorder, unspecified: Secondary | ICD-10-CM | POA: Insufficient documentation

## 2018-07-09 DIAGNOSIS — F1410 Cocaine abuse, uncomplicated: Secondary | ICD-10-CM | POA: Insufficient documentation

## 2018-07-09 DIAGNOSIS — R590 Localized enlarged lymph nodes: Secondary | ICD-10-CM | POA: Insufficient documentation

## 2018-07-09 MED ORDER — AMOXICILLIN 500 MG PO CAPS: 500.0000 mg | ORAL_CAPSULE | Freq: Two times a day (BID) | ORAL | 0 refills | Status: AC

## 2018-07-09 NOTE — Discharge Instructions (Signed)
Thank you for allowing me to care for you today in the Emergency Department.   Take 1 tablet of amoxicillin by mouth 2 times daily for the next 7 days.  Call and schedule follow-up appointment with your primary care provider for early next week.  You can swish and spit warm salt water to help with your jaw pain.   Lidocaine cream or patches are available over-the-counter and can be applied to areas that are sore as directed on the label.  Return to the emergency department if you develop a high fever, if you become unable to move your neck because it is stiff, if you develop double vision, new numbness or weakness, or other new, concerning symptoms.

## 2018-07-09 NOTE — ED Notes (Signed)
Patient verbalizes understanding of discharge instructions. Opportunity for questioning and answers were provided. Ambulatory at discharge in NAD.  
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
## 2018-07-09 NOTE — ED Triage Notes (Signed)
Pt reports headache since yesterday and a "knot" on the back left side of his head, pt called pcp who sent him to the ED. Pt also reports he stopped taking opioids 3 weeks ago cold [place] after taking them for a few years. Pt calm, skin dry, vss. A/ox4, resp e/u, nad.

## 2018-07-10 LAB — HIV ANTIBODY (ROUTINE TESTING W REFLEX): HIV Screen 4th Generation wRfx: NONREACTIVE

## 2018-12-17 ENCOUNTER — Other Ambulatory Visit: Payer: Self-pay

## 2018-12-17 ENCOUNTER — Emergency Department (HOSPITAL_COMMUNITY)
Admission: EM | Admit: 2018-12-17 | Discharge: 2018-12-17 | Disposition: A | Discharge status: Home / Self Care | Payer: Self-pay | Attending: Emergency Medicine | Admitting: Emergency Medicine

## 2018-12-17 ENCOUNTER — Encounter (HOSPITAL_COMMUNITY): Payer: Self-pay

## 2018-12-17 DIAGNOSIS — F1290 Cannabis use, unspecified, uncomplicated: Secondary | ICD-10-CM | POA: Insufficient documentation

## 2018-12-17 DIAGNOSIS — Z91010 Allergy to peanuts: Secondary | ICD-10-CM | POA: Insufficient documentation

## 2018-12-17 DIAGNOSIS — I10 Essential (primary) hypertension: Secondary | ICD-10-CM | POA: Insufficient documentation

## 2018-12-17 DIAGNOSIS — Z79899 Other long term (current) drug therapy: Secondary | ICD-10-CM | POA: Insufficient documentation

## 2018-12-17 NOTE — ED Notes (Signed)
Pt ambulated to restroom without difficulty
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
## 2018-12-17 NOTE — ED Triage Notes (Signed)
Patient arrives by [facility]-patient called 911 because "he feels funny"-patient smoked marijuana and drank beer.

## 2018-12-17 NOTE — ED Notes (Signed)
[facility] Expected date:  Expected time:  Means of arrival:  Comments: EMS 37 yo male intoxicated and smoked weed-"feels funny"

## 2018-12-17 NOTE — Discharge Instructions (Signed)
Try to avoid alcohol and smoking marijuana.  These can certainly cause adverse reactions in the body. Return here for new concerns.

## 2019-12-08 ENCOUNTER — Emergency Department (HOSPITAL_COMMUNITY)
Admission: EM | Admit: 2019-12-08 | Discharge: 2019-12-08 | Disposition: A | Discharge status: Home / Self Care | Payer: Self-pay | Attending: Emergency Medicine | Admitting: Emergency Medicine

## 2019-12-08 ENCOUNTER — Emergency Department (HOSPITAL_COMMUNITY): Payer: Self-pay

## 2019-12-08 ENCOUNTER — Other Ambulatory Visit: Payer: Self-pay

## 2019-12-08 DIAGNOSIS — R1013 Epigastric pain: Secondary | ICD-10-CM | POA: Insufficient documentation

## 2019-12-08 DIAGNOSIS — R197 Diarrhea, unspecified: Secondary | ICD-10-CM | POA: Insufficient documentation

## 2019-12-08 DIAGNOSIS — R110 Nausea: Secondary | ICD-10-CM | POA: Insufficient documentation

## 2019-12-08 DIAGNOSIS — I10 Essential (primary) hypertension: Secondary | ICD-10-CM | POA: Insufficient documentation

## 2019-12-08 DIAGNOSIS — R079 Chest pain, unspecified: Secondary | ICD-10-CM | POA: Insufficient documentation

## 2019-12-08 DIAGNOSIS — R112 Nausea with vomiting, unspecified: Secondary | ICD-10-CM | POA: Insufficient documentation

## 2019-12-08 DIAGNOSIS — Z79899 Other long term (current) drug therapy: Secondary | ICD-10-CM | POA: Insufficient documentation

## 2019-12-08 DIAGNOSIS — Z7982 Long term (current) use of aspirin: Secondary | ICD-10-CM | POA: Insufficient documentation

## 2019-12-08 DIAGNOSIS — F17210 Nicotine dependence, cigarettes, uncomplicated: Secondary | ICD-10-CM | POA: Insufficient documentation

## 2019-12-08 DIAGNOSIS — Z91010 Allergy to peanuts: Secondary | ICD-10-CM | POA: Insufficient documentation

## 2019-12-08 LAB — BASIC METABOLIC PANEL
Anion gap: 9 (ref 5–15)
BUN: 11 mg/dL (ref 6–20)
CO2: 24 mmol/L (ref 22–32)
Calcium: 9 mg/dL (ref 8.9–10.3)
Chloride: 104 mmol/L (ref 98–111)
Creatinine, Ser: 1.1 mg/dL (ref 0.61–1.24)
GFR calc Af Amer: >60 mL/min (ref >60)
GFR calc non Af Amer: >60 mL/min (ref >60)
Glucose, Bld: 115 mg/dL — ABNORMAL HIGH (ref 70–99)
Potassium: 3.7 mmol/L (ref 3.5–5.1)
Sodium: 137 mmol/L (ref 135–145)

## 2019-12-08 LAB — I-STAT CHEM 8, ED
BUN: 12 mg/dL (ref 6–20)
Calcium, Ion: 1.2 mmol/L (ref 1.15–1.40)
Chloride: 104 mmol/L (ref 98–111)
Creatinine, Ser: 0.9 mg/dL (ref 0.61–1.24)
Glucose, Bld: 111 mg/dL — ABNORMAL HIGH (ref 70–99)
HCT: 41 % (ref 39.0–52.0)
Hemoglobin: 13.9 g/dL (ref 13.0–17.0)
Potassium: 3.8 mmol/L (ref 3.5–5.1)
Sodium: 138 mmol/L (ref 135–145)
TCO2: 27 mmol/L (ref 22–32)

## 2019-12-08 LAB — HEPATIC FUNCTION PANEL
ALT: 40 U/L (ref 0–44)
AST: 28 U/L (ref 15–41)
Albumin: 3.6 g/dL (ref 3.5–5.0)
Alkaline Phosphatase: 47 U/L (ref 38–126)
Bilirubin, Direct: <0.1 mg/dL (ref 0.0–0.2)
Total Bilirubin: 0.4 mg/dL (ref 0.3–1.2)
Total Protein: 6.8 g/dL (ref 6.5–8.1)

## 2019-12-08 LAB — CBC
HCT: 39.6 % (ref 39.0–52.0)
Hemoglobin: 13.4 g/dL (ref 13.0–17.0)
MCH: 33 pg (ref 26.0–34.0)
MCHC: 33.8 g/dL (ref 30.0–36.0)
MCV: 97.5 fL (ref 80.0–100.0)
Platelets: 202 10*3/uL (ref 150–400)
RBC: 4.06 MIL/uL — ABNORMAL LOW (ref 4.22–5.81)
RDW: 12.5 % (ref 11.5–15.5)
WBC: 4.2 10*3/uL (ref 4.0–10.5)
nRBC: 0 % (ref 0.0–0.2)

## 2019-12-08 LAB — TROPONIN I (HIGH SENSITIVITY)
Troponin I (High Sensitivity): 3 ng/L (ref <18)
Troponin I (High Sensitivity): 2 ng/L (ref <18)

## 2019-12-08 LAB — LIPASE, BLOOD: Lipase: 24 U/L (ref 11–51)

## 2019-12-08 MED ORDER — ONDANSETRON HCL 4 MG/2ML IJ SOLN
4.0000 mg | Freq: Once | INTRAMUSCULAR | Status: AC
  Administered 2019-12-08: 4 mg via INTRAVENOUS
  Filled 2019-12-08: qty 2

## 2019-12-08 MED ORDER — ONDANSETRON HCL 4 MG PO TABS: 4.0000 mg | ORAL_TABLET | Freq: Four times a day (QID) | ORAL | 0 refills | Status: AC

## 2019-12-08 MED ORDER — SODIUM CHLORIDE 0.9% FLUSH: 3.0000 mL | Freq: Once | INTRAVENOUS | Status: DC

## 2019-12-08 MED ORDER — SODIUM CHLORIDE 0.9 % IV BOLUS
1000.0000 mL | Freq: Once | INTRAVENOUS | Status: AC
  Administered 2019-12-08: 1000 mL via INTRAVENOUS

## 2019-12-08 NOTE — ED Triage Notes (Signed)
To ED via private vehicle- c/o chest pain intermittently woke up out of sleep= same thing happened Friday - left arm "tight" also- did take ASA this am--  Had covid test Tuesday -- negative results yesterday--
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
## 2020-03-19 ENCOUNTER — Encounter (HOSPITAL_COMMUNITY): Payer: Self-pay | Admitting: Emergency Medicine

## 2020-03-19 ENCOUNTER — Emergency Department (HOSPITAL_COMMUNITY)
Admission: EM | Admit: 2020-03-19 | Discharge: 2020-03-20 | Disposition: A | Discharge status: Home / Self Care | Payer: Self-pay | Attending: Emergency Medicine | Admitting: Emergency Medicine

## 2020-03-19 ENCOUNTER — Other Ambulatory Visit: Payer: Self-pay

## 2020-03-19 DIAGNOSIS — H6692 Otitis media, unspecified, left ear: Secondary | ICD-10-CM | POA: Insufficient documentation

## 2020-03-19 DIAGNOSIS — R509 Fever, unspecified: Secondary | ICD-10-CM | POA: Insufficient documentation

## 2020-03-19 LAB — COMPREHENSIVE METABOLIC PANEL
ALT: 43 U/L (ref 0–44)
AST: 31 U/L (ref 15–41)
Albumin: 4.2 g/dL (ref 3.5–5.0)
Alkaline Phosphatase: 45 U/L (ref 38–126)
Anion gap: 11 (ref 5–15)
BUN: 11 mg/dL (ref 6–20)
CO2: 26 mmol/L (ref 22–32)
Calcium: 9.6 mg/dL (ref 8.9–10.3)
Chloride: 100 mmol/L (ref 98–111)
Creatinine, Ser: 1.17 mg/dL (ref 0.61–1.24)
GFR calc Af Amer: >60 mL/min (ref >60)
GFR calc non Af Amer: >60 mL/min (ref >60)
Glucose, Bld: 106 mg/dL — ABNORMAL HIGH (ref 70–99)
Potassium: 4.1 mmol/L (ref 3.5–5.1)
Sodium: 137 mmol/L (ref 135–145)
Total Bilirubin: 1 mg/dL (ref 0.3–1.2)
Total Protein: 8 g/dL (ref 6.5–8.1)

## 2020-03-19 LAB — CBC WITH DIFFERENTIAL/PLATELET
Abs Immature Granulocytes: 0.01 10*3/uL (ref 0.00–0.07)
Basophils Absolute: 0 10*3/uL (ref 0.0–0.1)
Basophils Relative: 1 %
Eosinophils Absolute: 0.2 10*3/uL (ref 0.0–0.5)
Eosinophils Relative: 4 %
HCT: 45.5 % (ref 39.0–52.0)
Hemoglobin: 15.1 g/dL (ref 13.0–17.0)
Immature Granulocytes: 0 %
Lymphocytes Relative: 27 %
Lymphs Abs: 1.3 10*3/uL (ref 0.7–4.0)
MCH: 32.3 pg (ref 26.0–34.0)
MCHC: 33.2 g/dL (ref 30.0–36.0)
MCV: 97.4 fL (ref 80.0–100.0)
Monocytes Absolute: 0.5 10*3/uL (ref 0.1–1.0)
Monocytes Relative: 11 %
Neutro Abs: 2.8 10*3/uL (ref 1.7–7.7)
Neutrophils Relative %: 57 %
Platelets: 232 10*3/uL (ref 150–400)
RBC: 4.67 MIL/uL (ref 4.22–5.81)
RDW: 12.5 % (ref 11.5–15.5)
WBC: 4.8 10*3/uL (ref 4.0–10.5)
nRBC: 0 % (ref 0.0–0.2)

## 2020-03-19 NOTE — ED Triage Notes (Signed)
Pt to ED with c/o pain to left side of neck under left ear.  Onset 2 days ago.  Pt also st's he has not been able to eat in past 2 days.  Pt denies any nausea or vomiting.  Denies dental pain

## 2020-03-20 MED ORDER — AMOXICILLIN-POT CLAVULANATE 875-125 MG PO TABS: 1.0000 | ORAL_TABLET | Freq: Two times a day (BID) | ORAL | 0 refills | Status: AC

## 2020-03-20 MED ORDER — AMOXICILLIN-POT CLAVULANATE 875-125 MG PO TABS
1.0000 | ORAL_TABLET | Freq: Once | ORAL | Status: AC
  Administered 2020-03-20: 1 via ORAL
  Filled 2020-03-20: qty 1
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
## 2020-03-20 NOTE — Discharge Instructions (Addendum)
Thank you for allowing me to care for you today in the Emergency Department.   You were seen today for left ear/jaw pain and fever.  You were found to have an ear infection on the left.  You were given your first dose of antibiotics in the ER.  Take 1 tablet of Augmentin 2 times daily for the next 10 days starting tomorrow morning.  Make sure to finish the entire course of antibiotics.  You can continue to take your home pain medication for pain control.  Take your home naproxen to help with inflammation.  You can follow-up with ear nose and throat if your symptoms do not resolve by the time you finish the antibiotics or to follow-up since you have had recurrent infections.  Return to the emergency department if you develop persistent fevers despite taking Tylenol and naproxen, if you become unable to move your neck, start to have changes in your vision, redness, warmth, or swelling around the eyes, bloody drainage from the ear, or other new, concerning symptoms.

## 2020-07-12 ENCOUNTER — Ambulatory Visit: Payer: Self-pay | Attending: Internal Medicine

## 2020-07-12 DIAGNOSIS — Z23 Encounter for immunization: Secondary | ICD-10-CM

## 2020-07-12 NOTE — Progress Notes (Signed)
   Covid-19 Vaccination Clinic  Name:  Kristopher Dunn    MRN: [id number] DOB: [birth year]  07/12/2020  Mr. [name] was observed post Covid-19 immunization for 15 minutes without incident. He was provided with Vaccine Information Sheet and instruction to access the V-Safe system.   Mr. [name] was instructed to call 911 with any severe reactions post vaccine: Marland Kitchen Difficulty breathing  . Swelling of face and throat  . A fast heartbeat  . A bad rash all over body  . Dizziness and weakness   Immunizations Administered    Name Date Dose VIS Date Route   Pfizer COVID-19 Vaccine 07/12/2020  4:03 PM 0.3 mL 02/15/2019 Intramuscular   Manufacturer: [employer], [employer]   Lot: [id number]   NDC: [address]-4

## 2020-07-14 ENCOUNTER — Emergency Department (HOSPITAL_COMMUNITY): Admission: EM | Admit: 2020-07-14 | Discharge: 2020-07-14 | Discharge status: Against Medical Advice | Payer: Self-pay

## 2020-07-14 ENCOUNTER — Ambulatory Visit (HOSPITAL_COMMUNITY)
Admission: EM | Admit: 2020-07-14 | Discharge: 2020-07-14 | Disposition: A | Discharge status: Home / Self Care | Payer: HRSA Program | Attending: Internal Medicine | Admitting: Internal Medicine

## 2020-07-14 ENCOUNTER — Encounter (HOSPITAL_COMMUNITY): Payer: Self-pay | Admitting: Emergency Medicine

## 2020-07-14 ENCOUNTER — Other Ambulatory Visit: Payer: Self-pay

## 2020-07-14 DIAGNOSIS — Z79899 Other long term (current) drug therapy: Secondary | ICD-10-CM | POA: Insufficient documentation

## 2020-07-14 DIAGNOSIS — Z7982 Long term (current) use of aspirin: Secondary | ICD-10-CM | POA: Diagnosis not present

## 2020-07-14 DIAGNOSIS — F17210 Nicotine dependence, cigarettes, uncomplicated: Secondary | ICD-10-CM | POA: Insufficient documentation

## 2020-07-14 DIAGNOSIS — Z8249 Family history of ischemic heart disease and other diseases of the circulatory system: Secondary | ICD-10-CM | POA: Diagnosis not present

## 2020-07-14 DIAGNOSIS — Z20822 Contact with and (suspected) exposure to covid-19: Secondary | ICD-10-CM | POA: Insufficient documentation

## 2020-07-14 DIAGNOSIS — I10 Essential (primary) hypertension: Secondary | ICD-10-CM | POA: Insufficient documentation

## 2020-07-14 DIAGNOSIS — J069 Acute upper respiratory infection, unspecified: Secondary | ICD-10-CM | POA: Diagnosis not present

## 2020-07-14 DIAGNOSIS — J45909 Unspecified asthma, uncomplicated: Secondary | ICD-10-CM | POA: Diagnosis not present

## 2020-07-14 LAB — SARS CORONAVIRUS 2 (TAT 6-24 HRS): SARS Coronavirus 2: NEGATIVE

## 2020-07-14 NOTE — ED Triage Notes (Signed)
Pt c/o sob and productive cough with phlegm x 4-5 days. He states he has had to use his inhaler more frequently. Pt states he also has a headache. Pt states he had his first covid vaccine 2 days ago.

## 2020-07-14 NOTE — Discharge Instructions (Signed)
Self isolate until covid results are back and negative.  Will notify you by phone of any positive findings. Your negative results will be sent through your MyChart.     Push fluids to ensure adequate hydration and keep secretions thin.  Tylenol and/or ibuprofen as needed for pain or fevers.  Over the counter medications as needed for symptoms.  Use of inhaler as needed for wheezing or shortness of breath.   If symptoms worsen or do not improve in the next week to return to be seen or to follow up with your PCP.
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
## 2020-08-07 ENCOUNTER — Ambulatory Visit: Payer: Self-pay | Attending: Internal Medicine

## 2020-08-07 DIAGNOSIS — Z23 Encounter for immunization: Secondary | ICD-10-CM

## 2020-08-07 NOTE — Progress Notes (Signed)
   Covid-19 Vaccination Clinic  Name:  Kristopher Dunn    MRN: [id number] DOB: 1981-10-11  08/07/2020  Kristopher Dunn was observed post Covid-19 immunization for 15 minutes without incident. He was provided with Vaccine Information Sheet and instruction to access the V-Safe system.   Kristopher Dunn was instructed to call 911 with any severe reactions post vaccine: Marland Kitchen Difficulty breathing  . Swelling of face and throat  . A fast heartbeat  . A bad rash all over body  . Dizziness and weakness   Immunizations Administered    Name Date Dose VIS Date Route   Pfizer COVID-19 Vaccine 08/07/2020  2:03 PM 0.3 mL 02/15/2019 Intramuscular   Manufacturer: [employer], [employer]   Lot: [id number]   NDC: [address]-8

## 2020-11-04 ENCOUNTER — Encounter (HOSPITAL_COMMUNITY): Payer: Self-pay | Admitting: Emergency Medicine

## 2020-11-04 ENCOUNTER — Other Ambulatory Visit: Payer: Self-pay

## 2020-11-04 ENCOUNTER — Emergency Department (HOSPITAL_COMMUNITY)
Admission: EM | Admit: 2020-11-04 | Discharge: 2020-11-04 | Disposition: A | Discharge status: Home / Self Care | Payer: Self-pay | Attending: Emergency Medicine | Admitting: Emergency Medicine

## 2020-11-04 DIAGNOSIS — Z20822 Contact with and (suspected) exposure to covid-19: Secondary | ICD-10-CM | POA: Insufficient documentation

## 2020-11-04 DIAGNOSIS — J029 Acute pharyngitis, unspecified: Secondary | ICD-10-CM | POA: Insufficient documentation

## 2020-11-04 DIAGNOSIS — F17210 Nicotine dependence, cigarettes, uncomplicated: Secondary | ICD-10-CM | POA: Insufficient documentation

## 2020-11-04 DIAGNOSIS — J069 Acute upper respiratory infection, unspecified: Secondary | ICD-10-CM | POA: Insufficient documentation

## 2020-11-04 LAB — RESPIRATORY PANEL BY RT PCR (FLU A&B, COVID)
Influenza A by PCR: NEGATIVE
Influenza B by PCR: NEGATIVE
SARS Coronavirus 2 by RT PCR: NEGATIVE

## 2020-11-04 LAB — GROUP A STREP BY PCR: Group A Strep by PCR: NOT DETECTED
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
## 2020-11-04 NOTE — Discharge Instructions (Addendum)
You were seen in the emergency department for sore throat runny nose blocked ears.  Your Covid test was negative along with your strep test.  This is probably viral.  Tylenol and ibuprofen for pain, warm salt water gargles.  Follow-up with your doctor and return to the emergency department if any worsening or concerning symptoms.

## 2020-11-04 NOTE — ED Triage Notes (Signed)
Pt c/o shortness of breath, cough and nasal congestion x 1 day. Reports being fully vaccinated against covid, no known covid contacts.

## 2020-12-22 ENCOUNTER — Encounter (HOSPITAL_COMMUNITY): Payer: Self-pay | Admitting: Emergency Medicine

## 2020-12-22 ENCOUNTER — Other Ambulatory Visit: Payer: Self-pay

## 2020-12-22 ENCOUNTER — Emergency Department (HOSPITAL_COMMUNITY): Payer: 59

## 2020-12-22 ENCOUNTER — Emergency Department (HOSPITAL_COMMUNITY)
Admission: EM | Admit: 2020-12-22 | Discharge: 2020-12-22 | Disposition: A | Discharge status: Home / Self Care | Payer: 59 | Attending: Emergency Medicine | Admitting: Emergency Medicine

## 2020-12-22 DIAGNOSIS — R0602 Shortness of breath: Secondary | ICD-10-CM | POA: Insufficient documentation

## 2020-12-22 DIAGNOSIS — R079 Chest pain, unspecified: Secondary | ICD-10-CM | POA: Insufficient documentation

## 2020-12-22 DIAGNOSIS — B349 Viral infection, unspecified: Secondary | ICD-10-CM

## 2020-12-22 DIAGNOSIS — Z79899 Other long term (current) drug therapy: Secondary | ICD-10-CM | POA: Diagnosis not present

## 2020-12-22 DIAGNOSIS — J45909 Unspecified asthma, uncomplicated: Secondary | ICD-10-CM | POA: Diagnosis not present

## 2020-12-22 DIAGNOSIS — Z20822 Contact with and (suspected) exposure to covid-19: Secondary | ICD-10-CM | POA: Insufficient documentation

## 2020-12-22 DIAGNOSIS — R059 Cough, unspecified: Secondary | ICD-10-CM | POA: Diagnosis not present

## 2020-12-22 DIAGNOSIS — F17210 Nicotine dependence, cigarettes, uncomplicated: Secondary | ICD-10-CM | POA: Insufficient documentation

## 2020-12-22 DIAGNOSIS — Z7982 Long term (current) use of aspirin: Secondary | ICD-10-CM | POA: Insufficient documentation

## 2020-12-22 DIAGNOSIS — I10 Essential (primary) hypertension: Secondary | ICD-10-CM | POA: Diagnosis not present

## 2020-12-22 DIAGNOSIS — Z91010 Allergy to peanuts: Secondary | ICD-10-CM | POA: Diagnosis not present

## 2020-12-22 DIAGNOSIS — R531 Weakness: Secondary | ICD-10-CM | POA: Insufficient documentation

## 2020-12-22 DIAGNOSIS — R0789 Other chest pain: Secondary | ICD-10-CM

## 2020-12-22 HISTORY — DX: Unspecified asthma, uncomplicated: J45.909

## 2020-12-22 LAB — CBC
HCT: 41.6 % (ref 39.0–52.0)
Hemoglobin: 13.5 g/dL (ref 13.0–17.0)
MCH: 31.7 pg (ref 26.0–34.0)
MCHC: 32.5 g/dL (ref 30.0–36.0)
MCV: 97.7 fL (ref 80.0–100.0)
Platelets: 187 10*3/uL (ref 150–400)
RBC: 4.26 MIL/uL (ref 4.22–5.81)
RDW: 13.1 % (ref 11.5–15.5)
WBC: 4.8 10*3/uL (ref 4.0–10.5)
nRBC: 0 % (ref 0.0–0.2)

## 2020-12-22 LAB — BASIC METABOLIC PANEL
Anion gap: 14 (ref 5–15)
BUN: 16 mg/dL (ref 6–20)
CO2: 24 mmol/L (ref 22–32)
Calcium: 9.8 mg/dL (ref 8.9–10.3)
Chloride: 101 mmol/L (ref 98–111)
Creatinine, Ser: 0.92 mg/dL (ref 0.61–1.24)
GFR, Estimated: >60 mL/min (ref >60)
Glucose, Bld: 106 mg/dL — ABNORMAL HIGH (ref 70–99)
Potassium: 3.6 mmol/L (ref 3.5–5.1)
Sodium: 139 mmol/L (ref 135–145)

## 2020-12-22 LAB — RESP PANEL BY RT-PCR (FLU A&B, COVID) ARPGX2
Influenza A by PCR: NEGATIVE
Influenza B by PCR: NEGATIVE
SARS Coronavirus 2 by RT PCR: NEGATIVE

## 2020-12-22 LAB — TROPONIN I (HIGH SENSITIVITY): Troponin I (High Sensitivity): 4 ng/L (ref <18)

## 2020-12-22 MED ORDER — LIDOCAINE VISCOUS HCL 2 % MT SOLN
15.0000 mL | Freq: Once | OROMUCOSAL | Status: AC
  Administered 2020-12-22: 15 mL via ORAL
  Filled 2020-12-22: qty 15

## 2020-12-22 MED ORDER — ALUM & MAG HYDROXIDE-SIMETH 200-200-20 MG/5ML PO SUSP
30.0000 mL | Freq: Once | ORAL | Status: AC
  Administered 2020-12-22: 30 mL via ORAL
  Filled 2020-12-22: qty 30
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
## 2020-12-22 NOTE — ED Triage Notes (Signed)
Pt reports SOB, cough, generalized weakness, and body aches x 10 days.  States he had a fever 3-4 days ago.  He tested negative for COVID last week but states everyone that he has close contact with has tested positive for COVID.

## 2020-12-22 NOTE — Discharge Instructions (Addendum)
You were evaluated in the emergency department today for your fatigue, body aches, shortness of breath, and chest pain.  Your physical exam, vital signs, laboratory studies, chest x-ray, and EKG were all very reassuring.  You tested negative for COVID-19 and influenza A/B.  I suspect you have another viral syndrome.  There does not appear to be any emergent problem with your heart or your lungs.  Recommend you continue to rest, recommend you follow-up with a primary care doctor.  Below is contact information for [facility], who may care for you while you work on establishing new primary care.  Below is the contact information for Dr. Algie Coffer, cardiologist.  Recommend that you call and schedule a follow-up appointment for atypical chest pain.  Return to the emergency department if develop any worsening chest pain, difficulty breathing, nausea or vomiting that does not stop, or any other new severe symptoms.

## 2021-01-07 ENCOUNTER — Encounter (HOSPITAL_COMMUNITY): Payer: Self-pay | Admitting: *Deleted

## 2021-01-07 ENCOUNTER — Emergency Department (HOSPITAL_COMMUNITY)
Admission: EM | Admit: 2021-01-07 | Discharge: 2021-01-08 | Disposition: A | Discharge status: Home / Self Care | Payer: 59 | Attending: Emergency Medicine | Admitting: Emergency Medicine

## 2021-01-07 ENCOUNTER — Other Ambulatory Visit: Payer: Self-pay

## 2021-01-07 DIAGNOSIS — R509 Fever, unspecified: Secondary | ICD-10-CM | POA: Diagnosis present

## 2021-01-07 DIAGNOSIS — Z7982 Long term (current) use of aspirin: Secondary | ICD-10-CM | POA: Diagnosis not present

## 2021-01-07 DIAGNOSIS — Z91010 Allergy to peanuts: Secondary | ICD-10-CM | POA: Diagnosis not present

## 2021-01-07 DIAGNOSIS — Z79899 Other long term (current) drug therapy: Secondary | ICD-10-CM | POA: Insufficient documentation

## 2021-01-07 DIAGNOSIS — F17210 Nicotine dependence, cigarettes, uncomplicated: Secondary | ICD-10-CM | POA: Insufficient documentation

## 2021-01-07 DIAGNOSIS — I10 Essential (primary) hypertension: Secondary | ICD-10-CM | POA: Insufficient documentation

## 2021-01-07 DIAGNOSIS — U071 COVID-19: Secondary | ICD-10-CM | POA: Diagnosis not present

## 2021-01-07 DIAGNOSIS — J45909 Unspecified asthma, uncomplicated: Secondary | ICD-10-CM | POA: Insufficient documentation

## 2021-01-07 DIAGNOSIS — Z20822 Contact with and (suspected) exposure to covid-19: Secondary | ICD-10-CM

## 2021-01-07 LAB — CBC
HCT: 41.3 % (ref 39.0–52.0)
Hemoglobin: 14.1 g/dL (ref 13.0–17.0)
MCH: 32.3 pg (ref 26.0–34.0)
MCHC: 34.1 g/dL (ref 30.0–36.0)
MCV: 94.5 fL (ref 80.0–100.0)
Platelets: 163 10*3/uL (ref 150–400)
RBC: 4.37 MIL/uL (ref 4.22–5.81)
RDW: 12.8 % (ref 11.5–15.5)
WBC: 4.4 10*3/uL (ref 4.0–10.5)
nRBC: 0 % (ref 0.0–0.2)

## 2021-01-07 LAB — COMPREHENSIVE METABOLIC PANEL
ALT: 44 U/L (ref 0–44)
AST: 35 U/L (ref 15–41)
Albumin: 3.9 g/dL (ref 3.5–5.0)
Alkaline Phosphatase: 60 U/L (ref 38–126)
Anion gap: 10 (ref 5–15)
BUN: 10 mg/dL (ref 6–20)
CO2: 25 mmol/L (ref 22–32)
Calcium: 9 mg/dL (ref 8.9–10.3)
Chloride: 103 mmol/L (ref 98–111)
Creatinine, Ser: 1.04 mg/dL (ref 0.61–1.24)
GFR, Estimated: >60 mL/min (ref >60)
Glucose, Bld: 118 mg/dL — ABNORMAL HIGH (ref 70–99)
Potassium: 3.8 mmol/L (ref 3.5–5.1)
Sodium: 138 mmol/L (ref 135–145)
Total Bilirubin: 0.7 mg/dL (ref 0.3–1.2)
Total Protein: 7.4 g/dL (ref 6.5–8.1)

## 2021-01-07 LAB — LIPASE, BLOOD: Lipase: 28 U/L (ref 11–51)

## 2021-01-07 NOTE — ED Triage Notes (Addendum)
Pt reports having flu like symptoms x 1 month. Test neg for covid on 1/1 but reports still having bodyaches, headaches, congestion, cough, fever. No acute distress is noted at triage.also reports dizziness and n/v.

## 2021-01-08 ENCOUNTER — Encounter (HOSPITAL_COMMUNITY): Payer: Self-pay | Admitting: Emergency Medicine

## 2021-01-08 LAB — SARS CORONAVIRUS 2 (TAT 6-24 HRS): SARS Coronavirus 2: POSITIVE — AB

## 2021-01-08 MED ORDER — IBUPROFEN 800 MG PO TABS
800.0000 mg | ORAL_TABLET | Freq: Once | ORAL | Status: AC
  Administered 2021-01-08: 800 mg via ORAL
  Filled 2021-01-08: qty 1
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
## 2021-01-08 NOTE — Discharge Instructions (Signed)
Person Under Monitoring Name: Kristopher Dunn  Location: [address] [place] [address]   Infection Prevention Recommendations for Individuals Confirmed to have, or Being Evaluated for, 2019 Novel Coronavirus (COVID-19) Infection Who Receive Care at Home  Individuals who are confirmed to have, or are being evaluated for, COVID-19 should follow the prevention steps below until a healthcare provider or local or state health department says they can return to normal activities.  Stay home except to get medical care You should restrict activities outside your home, except for getting medical care. Do not go to work, school, or public areas, and do not use public transportation or taxis.  Call ahead before visiting your doctor Before your medical appointment, call the healthcare provider and tell them that you have, or are being evaluated for, COVID-19 infection. This will help the healthcare provider's office take steps to keep other people from getting infected. Ask your healthcare provider to call the local or state health department.  Monitor your symptoms Seek prompt medical attention if your illness is worsening (e.g., difficulty breathing). Before going to your medical appointment, call the healthcare provider and tell them that you have, or are being evaluated for, COVID-19 infection. Ask your healthcare provider to call the local or state health department.  Wear a facemask You should wear a facemask that covers your nose and mouth when you are in the same room with other people and when you visit a healthcare provider. People who live with or visit you should also wear a facemask while they are in the same room with you.  Separate yourself from other people in your home As much as possible, you should stay in a different room from other people in your home. Also, you should use a separate bathroom, if available.  Avoid sharing household items You should not  share dishes, drinking glasses, cups, eating utensils, towels, bedding, or other items with other people in your home. After using these items, you should wash them thoroughly with soap and water.  Cover your coughs and sneezes Cover your mouth and nose with a tissue when you cough or sneeze, or you can cough or sneeze into your sleeve. Throw used tissues in a lined trash can, and immediately wash your hands with soap and water for at least 20 seconds or use an alcohol-based hand rub.  Wash your [employer] your hands often and thoroughly with soap and water for at least 20 seconds. You can use an alcohol-based hand sanitizer if soap and water are not available and if your hands are not visibly dirty. Avoid touching your eyes, nose, and mouth with unwashed hands.   Prevention Steps for Caregivers and Household Members of Individuals Confirmed to have, or Being Evaluated for, COVID-19 Infection Being Cared for in the Home  If you live with, or provide care at home for, a person confirmed to have, or being evaluated for, COVID-19 infection please follow these guidelines to prevent infection:  Follow healthcare provider's instructions Make sure that you understand and can help the patient follow any healthcare provider instructions for all care.  Provide for the patient's basic needs You should help the patient with basic needs in the home and provide support for getting groceries, prescriptions, and other personal needs.  Monitor the patient's symptoms If they are getting sicker, call his or her medical provider and tell them that the patient has, or is being evaluated for, COVID-19 infection. This will help the healthcare provider's office  take steps to keep other people from getting infected. Ask the healthcare provider to call the local or state health department.  Limit the number of people who have contact with the patient If possible, have only one caregiver for the  patient. Other household members should stay in another home or place of residence. If this is not possible, they should stay in another room, or be separated from the patient as much as possible. Use a separate bathroom, if available. Restrict visitors who do not have an essential need to be in the home.  Keep older adults, very young children, and other sick people away from the patient Keep older adults, very young children, and those who have compromised immune systems or chronic health conditions away from the patient. This includes people with chronic heart, lung, or kidney conditions, diabetes, and cancer.  Ensure good ventilation Make sure that shared spaces in the home have good air flow, such as from an air conditioner or an opened window, weather permitting.  Wash your hands often Wash your hands often and thoroughly with soap and water for at least 20 seconds. You can use an alcohol based hand sanitizer if soap and water are not available and if your hands are not visibly dirty. Avoid touching your eyes, nose, and mouth with unwashed hands. Use disposable paper towels to dry your hands. If not available, use dedicated cloth towels and replace them when they become wet.  Wear a facemask and gloves Wear a disposable facemask at all times in the room and gloves when you touch or have contact with the patient's blood, body fluids, and/or secretions or excretions, such as sweat, saliva, sputum, nasal mucus, vomit, urine, or feces.  Ensure the mask fits over your nose and mouth tightly, and do not touch it during use. Throw out disposable facemasks and gloves after using them. Do not reuse. Wash your hands immediately after removing your facemask and gloves. If your personal clothing becomes contaminated, carefully remove clothing and launder. Wash your hands after handling contaminated clothing. Place all used disposable facemasks, gloves, and other waste in a lined container before  disposing them with other household waste. Remove gloves and wash your hands immediately after handling these items.  Do not share dishes, glasses, or other household items with the patient Avoid sharing household items. You should not share dishes, drinking glasses, cups, eating utensils, towels, bedding, or other items with a patient who is confirmed to have, or being evaluated for, COVID-19 infection. After the person uses these items, you should wash them thoroughly with soap and water.  Wash laundry thoroughly Immediately remove and wash clothes or bedding that have blood, body fluids, and/or secretions or excretions, such as sweat, saliva, sputum, nasal mucus, vomit, urine, or feces, on them. Wear gloves when handling laundry from the patient. Read and follow directions on labels of laundry or clothing items and detergent. In general, wash and dry with the warmest temperatures recommended on the label.  Clean all areas the individual has used often Clean all touchable surfaces, such as counters, tabletops, doorknobs, bathroom fixtures, toilets, phones, keyboards, tablets, and bedside tables, every day. Also, clean any surfaces that may have blood, body fluids, and/or secretions or excretions on them. Wear gloves when cleaning surfaces the patient has come in contact with. Use a diluted bleach solution (e.g., dilute bleach with 1 part bleach and 10 parts water) or a household disinfectant with a label that says EPA-registered for coronaviruses. To make a bleach  solution at home, add 1 tablespoon of bleach to 1 quart (4 cups) of water. For a larger supply, add  cup of bleach to 1 gallon (16 cups) of water. Read labels of cleaning products and follow recommendations provided on product labels. Labels contain instructions for safe and effective use of the cleaning product including precautions you should take when applying the product, such as wearing gloves or eye protection and making sure you  have good ventilation during use of the product. Remove gloves and wash hands immediately after cleaning.  Monitor yourself for signs and symptoms of illness Caregivers and household members are considered close contacts, should monitor their health, and will be asked to limit movement outside of the home to the extent possible. Follow the monitoring steps for close contacts listed on the symptom monitoring form.   ? If you have additional questions, contact your local health department or call the epidemiologist on call at [phone] (available 24/7). ? This guidance is subject to change. For the most up-to-date guidance from [facility], please refer to their website: YouBlogs.pl

## 2021-01-10 ENCOUNTER — Other Ambulatory Visit: Payer: Self-pay

## 2021-01-10 ENCOUNTER — Emergency Department (HOSPITAL_COMMUNITY): Payer: 59

## 2021-01-10 ENCOUNTER — Emergency Department (HOSPITAL_COMMUNITY)
Admission: EM | Admit: 2021-01-10 | Discharge: 2021-01-10 | Disposition: A | Discharge status: Home / Self Care | Payer: 59 | Attending: Emergency Medicine | Admitting: Emergency Medicine

## 2021-01-10 DIAGNOSIS — Z91010 Allergy to peanuts: Secondary | ICD-10-CM | POA: Insufficient documentation

## 2021-01-10 DIAGNOSIS — Z79899 Other long term (current) drug therapy: Secondary | ICD-10-CM | POA: Insufficient documentation

## 2021-01-10 DIAGNOSIS — I10 Essential (primary) hypertension: Secondary | ICD-10-CM | POA: Diagnosis not present

## 2021-01-10 DIAGNOSIS — R059 Cough, unspecified: Secondary | ICD-10-CM | POA: Diagnosis present

## 2021-01-10 DIAGNOSIS — Z7982 Long term (current) use of aspirin: Secondary | ICD-10-CM | POA: Insufficient documentation

## 2021-01-10 DIAGNOSIS — F17210 Nicotine dependence, cigarettes, uncomplicated: Secondary | ICD-10-CM | POA: Insufficient documentation

## 2021-01-10 DIAGNOSIS — J45909 Unspecified asthma, uncomplicated: Secondary | ICD-10-CM | POA: Diagnosis not present

## 2021-01-10 DIAGNOSIS — U071 COVID-19: Secondary | ICD-10-CM | POA: Insufficient documentation

## 2021-01-10 LAB — CBC WITH DIFFERENTIAL/PLATELET
Abs Immature Granulocytes: 0.01 10*3/uL (ref 0.00–0.07)
Basophils Absolute: 0 10*3/uL (ref 0.0–0.1)
Basophils Relative: 1 %
Eosinophils Absolute: 0 10*3/uL (ref 0.0–0.5)
Eosinophils Relative: 1 %
HCT: 41.1 % (ref 39.0–52.0)
Hemoglobin: 13.7 g/dL (ref 13.0–17.0)
Immature Granulocytes: 0 %
Lymphocytes Relative: 25 %
Lymphs Abs: 1.1 10*3/uL (ref 0.7–4.0)
MCH: 32.1 pg (ref 26.0–34.0)
MCHC: 33.3 g/dL (ref 30.0–36.0)
MCV: 96.3 fL (ref 80.0–100.0)
Monocytes Absolute: 0.3 10*3/uL (ref 0.1–1.0)
Monocytes Relative: 6 %
Neutro Abs: 2.9 10*3/uL (ref 1.7–7.7)
Neutrophils Relative %: 67 %
Platelets: 214 10*3/uL (ref 150–400)
RBC: 4.27 MIL/uL (ref 4.22–5.81)
RDW: 12.8 % (ref 11.5–15.5)
WBC: 4.3 10*3/uL (ref 4.0–10.5)
nRBC: 0 % (ref 0.0–0.2)

## 2021-01-10 LAB — COMPREHENSIVE METABOLIC PANEL
ALT: 52 U/L — ABNORMAL HIGH (ref 0–44)
AST: 35 U/L (ref 15–41)
Albumin: 4.4 g/dL (ref 3.5–5.0)
Alkaline Phosphatase: 75 U/L (ref 38–126)
Anion gap: 12 (ref 5–15)
BUN: 8 mg/dL (ref 6–20)
CO2: 25 mmol/L (ref 22–32)
Calcium: 9.3 mg/dL (ref 8.9–10.3)
Chloride: 101 mmol/L (ref 98–111)
Creatinine, Ser: 1.02 mg/dL (ref 0.61–1.24)
GFR, Estimated: >60 mL/min (ref >60)
Glucose, Bld: 108 mg/dL — ABNORMAL HIGH (ref 70–99)
Potassium: 4.2 mmol/L (ref 3.5–5.1)
Sodium: 138 mmol/L (ref 135–145)
Total Bilirubin: 0.9 mg/dL (ref 0.3–1.2)
Total Protein: 8.4 g/dL — ABNORMAL HIGH (ref 6.5–8.1)

## 2021-01-10 MED ORDER — ALBUTEROL SULFATE HFA 108 (90 BASE) MCG/ACT IN AERS
1.0000 | INHALATION_SPRAY | Freq: Once | RESPIRATORY_TRACT | Status: AC
  Administered 2021-01-10: 1 via RESPIRATORY_TRACT
  Filled 2021-01-10: qty 6.7

## 2021-01-10 MED ORDER — PREDNISONE 20 MG PO TABS: 40.0000 mg | ORAL_TABLET | Freq: Every day | ORAL | 0 refills | Status: AC

## 2021-01-10 MED ORDER — SODIUM CHLORIDE 0.9 % IV BOLUS
1000.0000 mL | Freq: Once | INTRAVENOUS | Status: AC
  Administered 2021-01-10: 1000 mL via INTRAVENOUS

## 2021-01-10 MED ORDER — METHYLPREDNISOLONE SODIUM SUCC 125 MG IJ SOLR
80.0000 mg | Freq: Once | INTRAMUSCULAR | Status: AC
  Administered 2021-01-10: 80 mg via INTRAVENOUS
  Filled 2021-01-10: qty 2

## 2021-01-10 MED ORDER — ALBUTEROL SULFATE HFA 108 (90 BASE) MCG/ACT IN AERS: 1.0000 | INHALATION_SPRAY | Freq: Four times a day (QID) | RESPIRATORY_TRACT | 0 refills | Status: DC | PRN

## 2021-01-10 MED ORDER — ONDANSETRON 4 MG PO TBDP: 4.0000 mg | ORAL_TABLET | Freq: Three times a day (TID) | ORAL | 0 refills | Status: DC | PRN

## 2021-01-10 NOTE — ED Notes (Signed)
Patient ambulated around the room. Patient tolerated ambulation well reporting that breathing was "10 times better." O2 sats remained between 96-100%.

## 2021-01-10 NOTE — Discharge Instructions (Addendum)
Take prednisone as directed starting tomorrow.  Use inhaler as directed  Follow up with the COVID care clinic.   Make sure you are staying hydrated and drinking fluid.   Return to the Emergency Dept for any difficulty breathing, vomiting/inability to keep any food or liquid downs, chest pain or any other worsening or concerning symptoms.

## 2021-01-10 NOTE — ED Triage Notes (Signed)
Pt arrived via walk in, tested COVID (+) 1/17, states he cont with SOB, hx of asthma, Also requesting inhaler refill.
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
## 2021-01-14 ENCOUNTER — Ambulatory Visit: Payer: 59 | Attending: Internal Medicine | Admitting: Internal Medicine

## 2021-01-14 ENCOUNTER — Other Ambulatory Visit: Payer: Self-pay

## 2021-01-14 DIAGNOSIS — U071 COVID-19: Secondary | ICD-10-CM | POA: Diagnosis not present

## 2021-01-14 DIAGNOSIS — I10 Essential (primary) hypertension: Secondary | ICD-10-CM | POA: Diagnosis not present

## 2021-01-14 MED ORDER — AMLODIPINE BESYLATE 5 MG PO TABS: 5.0000 mg | ORAL_TABLET | Freq: Every day | ORAL | 2 refills | Status: DC

## 2021-01-14 MED ORDER — ATENOLOL 50 MG PO TABS: 50.0000 mg | ORAL_TABLET | Freq: Every day | ORAL | 3 refills | Status: DC

## 2021-01-14 MED ORDER — BENZONATATE 100 MG PO CAPS: 100.0000 mg | ORAL_CAPSULE | Freq: Two times a day (BID) | ORAL | 0 refills | Status: DC | PRN
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
## 2021-02-24 ENCOUNTER — Other Ambulatory Visit: Payer: Self-pay

## 2021-02-24 ENCOUNTER — Emergency Department (HOSPITAL_COMMUNITY): Payer: 59

## 2021-02-24 ENCOUNTER — Emergency Department (HOSPITAL_COMMUNITY)
Admission: EM | Admit: 2021-02-24 | Discharge: 2021-02-24 | Disposition: A | Discharge status: Home / Self Care | Payer: 59 | Attending: Emergency Medicine | Admitting: Emergency Medicine

## 2021-02-24 ENCOUNTER — Encounter (HOSPITAL_COMMUNITY): Payer: Self-pay | Admitting: Emergency Medicine

## 2021-02-24 DIAGNOSIS — F17210 Nicotine dependence, cigarettes, uncomplicated: Secondary | ICD-10-CM | POA: Diagnosis not present

## 2021-02-24 DIAGNOSIS — Z91010 Allergy to peanuts: Secondary | ICD-10-CM | POA: Insufficient documentation

## 2021-02-24 DIAGNOSIS — J45909 Unspecified asthma, uncomplicated: Secondary | ICD-10-CM | POA: Insufficient documentation

## 2021-02-24 DIAGNOSIS — I10 Essential (primary) hypertension: Secondary | ICD-10-CM | POA: Insufficient documentation

## 2021-02-24 DIAGNOSIS — R0789 Other chest pain: Secondary | ICD-10-CM | POA: Diagnosis present

## 2021-02-24 DIAGNOSIS — Z79899 Other long term (current) drug therapy: Secondary | ICD-10-CM | POA: Insufficient documentation

## 2021-02-24 DIAGNOSIS — Z8616 Personal history of COVID-19: Secondary | ICD-10-CM | POA: Insufficient documentation

## 2021-02-24 LAB — CBC
HCT: 40.1 % (ref 39.0–52.0)
Hemoglobin: 13.7 g/dL (ref 13.0–17.0)
MCH: 32.4 pg (ref 26.0–34.0)
MCHC: 34.2 g/dL (ref 30.0–36.0)
MCV: 94.8 fL (ref 80.0–100.0)
Platelets: 203 10*3/uL (ref 150–400)
RBC: 4.23 MIL/uL (ref 4.22–5.81)
RDW: 13.3 % (ref 11.5–15.5)
WBC: 4 10*3/uL (ref 4.0–10.5)
nRBC: 0 % (ref 0.0–0.2)

## 2021-02-24 LAB — BASIC METABOLIC PANEL
Anion gap: 11 (ref 5–15)
BUN: 10 mg/dL (ref 6–20)
CO2: 24 mmol/L (ref 22–32)
Calcium: 9.3 mg/dL (ref 8.9–10.3)
Chloride: 102 mmol/L (ref 98–111)
Creatinine, Ser: 1.14 mg/dL (ref 0.61–1.24)
GFR, Estimated: >60 mL/min (ref >60)
Glucose, Bld: 129 mg/dL — ABNORMAL HIGH (ref 70–99)
Potassium: 3.8 mmol/L (ref 3.5–5.1)
Sodium: 137 mmol/L (ref 135–145)

## 2021-02-24 LAB — TROPONIN I (HIGH SENSITIVITY)
Troponin I (High Sensitivity): 3 ng/L (ref <18)
Troponin I (High Sensitivity): 3 ng/L (ref <18)

## 2021-02-24 NOTE — ED Triage Notes (Signed)
C/o L sided chest pain x 2 hours with SOB, dizziness, and L arm numbness.  No neuro deficits.  Reports palpitations since having COVID in January.  Scheduled to see Cardiologist on Friday.

## 2021-02-24 NOTE — Discharge Instructions (Signed)
Take 4 over the counter ibuprofen tablets 3 times a day or 2 over-the-counter naproxen tablets twice a day for pain. Also take tylenol 1000mg(2 extra strength) four times a day.    
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
## 2021-02-24 NOTE — ED Notes (Signed)
E-signature pad unavailable at time of pt discharge. This RN discussed discharge materials with pt and answered all pt questions. Pt stated understanding of discharge material. ? ?

## 2021-03-01 ENCOUNTER — Ambulatory Visit: Payer: Self-pay | Admitting: Cardiology

## 2021-03-14 ENCOUNTER — Ambulatory Visit: Payer: 59 | Admitting: Internal Medicine

## 2021-03-18 ENCOUNTER — Encounter: Payer: Self-pay | Admitting: Cardiology

## 2021-03-18 ENCOUNTER — Other Ambulatory Visit: Payer: Self-pay

## 2021-03-18 ENCOUNTER — Ambulatory Visit: Payer: 59 | Admitting: Cardiology

## 2021-03-18 VITALS — BP 142/88 | HR 76 | Temp 98.2°F | Resp 17 | Ht 72.0 in | Wt 350.0 lb

## 2021-03-18 DIAGNOSIS — R002 Palpitations: Secondary | ICD-10-CM

## 2021-03-18 DIAGNOSIS — Z87898 Personal history of other specified conditions: Secondary | ICD-10-CM

## 2021-03-18 DIAGNOSIS — F1491 Cocaine use, unspecified, in remission: Secondary | ICD-10-CM

## 2021-03-18 DIAGNOSIS — Z8616 Personal history of COVID-19: Secondary | ICD-10-CM

## 2021-03-18 DIAGNOSIS — Z8249 Family history of ischemic heart disease and other diseases of the circulatory system: Secondary | ICD-10-CM

## 2021-03-18 DIAGNOSIS — Z87891 Personal history of nicotine dependence: Secondary | ICD-10-CM

## 2021-03-18 DIAGNOSIS — R072 Precordial pain: Secondary | ICD-10-CM
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
## 2021-04-01 ENCOUNTER — Other Ambulatory Visit: Payer: Self-pay

## 2021-04-01 ENCOUNTER — Inpatient Hospital Stay: Payer: 59

## 2021-04-01 ENCOUNTER — Ambulatory Visit: Payer: 59

## 2021-04-01 DIAGNOSIS — R002 Palpitations: Secondary | ICD-10-CM

## 2021-04-01 DIAGNOSIS — R072 Precordial pain: Secondary | ICD-10-CM

## 2021-04-02 ENCOUNTER — Telehealth (HOSPITAL_COMMUNITY): Payer: Self-pay | Admitting: Emergency Medicine

## 2021-04-02 NOTE — Telephone Encounter (Signed)
Attempted to call patient regarding upcoming cardiac CT appointment. °Left message on voicemail with name and callback number °Nyaira Hodgens RN Navigator Cardiac Imaging °[facility] °[phone] Office °[phone] Cell ° °

## 2021-04-02 NOTE — Telephone Encounter (Signed)
Reaching out to patient to offer assistance regarding upcoming cardiac imaging study; pt verbalizes understanding of appt date/time, parking situation and where to check in, pre-test NPO status and medications ordered, and verified current allergies; name and call back number provided for further questions should they arise Rockwell Alexandria RN Navigator Cardiac Imaging [facility] [phone] office [phone] cell  100mg  atenolol 2 hr prior to scan

## 2021-04-03 ENCOUNTER — Ambulatory Visit (HOSPITAL_COMMUNITY)
Admission: RE | Admit: 2021-04-03 | Discharge: 2021-04-03 | Disposition: A | Discharge status: Home / Self Care | Payer: 59 | Source: Ambulatory Visit | Attending: Internal Medicine | Admitting: Internal Medicine

## 2021-04-03 ENCOUNTER — Encounter (HOSPITAL_COMMUNITY): Payer: Self-pay

## 2021-04-03 ENCOUNTER — Other Ambulatory Visit: Payer: Self-pay

## 2021-04-03 ENCOUNTER — Encounter: Payer: 59 | Admitting: *Deleted

## 2021-04-03 ENCOUNTER — Inpatient Hospital Stay: Payer: 59

## 2021-04-03 DIAGNOSIS — R072 Precordial pain: Secondary | ICD-10-CM | POA: Diagnosis present

## 2021-04-03 DIAGNOSIS — Z006 Encounter for examination for normal comparison and control in clinical research program: Secondary | ICD-10-CM

## 2021-04-03 MED ORDER — IOHEXOL 350 MG/ML SOLN
80.0000 mL | Freq: Once | INTRAVENOUS | Status: AC | PRN
  Administered 2021-04-03: 80 mL via INTRAVENOUS

## 2021-04-03 MED ORDER — NITROGLYCERIN 0.4 MG SL SUBL
0.8000 mg | SUBLINGUAL_TABLET | Freq: Once | SUBLINGUAL | Status: AC
  Administered 2021-04-03: 0.8 mg via SUBLINGUAL

## 2021-04-03 MED ORDER — NITROGLYCERIN 0.4 MG SL SUBL
SUBLINGUAL_TABLET | SUBLINGUAL | Status: AC
  Filled 2021-04-03: qty 2

## 2021-04-03 NOTE — Research (Signed)
IDENTIFY Informed Consent                  Subject Name:   Kristopher Dunn   Subject met inclusion and exclusion criteria.  The informed consent form, study requirements and expectations were reviewed with the subject and questions and concerns were addressed prior to the signing of the consent form.  The subject verbalized understanding of the trial requirements.  The subject agreed to participate in the IDENTIFY trial and signed the informed consent.  The informed consent was obtained prior to performance of any protocol-specific procedures for the subject.  A copy of the signed informed consent was given to the subject and a copy was placed in the subject's medical record.   [place] Larin Weissberg, Research Assistant  04/03/2021  07:55 a.m.

## 2021-04-04 ENCOUNTER — Other Ambulatory Visit (HOSPITAL_COMMUNITY): Payer: Self-pay | Admitting: Emergency Medicine

## 2021-04-04 DIAGNOSIS — R079 Chest pain, unspecified: Secondary | ICD-10-CM

## 2021-04-04 NOTE — Progress Notes (Signed)
New order for CCTA placed

## 2021-04-05 ENCOUNTER — Encounter: Payer: Self-pay | Admitting: Cardiology

## 2021-04-15 ENCOUNTER — Telehealth: Payer: Self-pay

## 2021-04-15 NOTE — Telephone Encounter (Signed)
As long he is asymptomatic he can proceed with the occupational testing.

## 2021-04-15 NOTE — Telephone Encounter (Signed)
error 

## 2021-04-15 NOTE — Telephone Encounter (Signed)
He is going to need it on paper are you able to sign one of the clearance forms we have in office

## 2021-04-15 NOTE — Telephone Encounter (Signed)
Patient came to the office requesting a clearance for his new job patient needs to complete a "step test physical" and due to him wearing a heart monitor they are requesting clearance. Patient states test should not stress his heart patient states the exam consists of him lifting a certain weight patient is currently in the lobby waiting please advise

## 2021-04-16 ENCOUNTER — Telehealth (HOSPITAL_COMMUNITY): Payer: Self-pay | Admitting: Emergency Medicine

## 2021-04-16 ENCOUNTER — Telehealth: Payer: Self-pay

## 2021-04-16 NOTE — Telephone Encounter (Signed)
Attempted to call patient regarding upcoming cardiac CT appointment. °Left message on voicemail with name and callback number °Michaela Broski RN Navigator Cardiac Imaging °[facility] °[phone] Office °[phone] Cell ° °

## 2021-04-16 NOTE — Telephone Encounter (Signed)
Pt called and stated that he is scheduled for a retake CT scan Thursday at 8:00am. Pt stated that the clinic told him to ask if you can perscribe him anything for anxiety before he goes in.

## 2021-04-16 NOTE — Telephone Encounter (Signed)
Reaching out to patient to offer assistance regarding upcoming cardiac imaging study; pt verbalizes understanding of appt date/time, parking situation and where to check in, pre-test NPO status and medications ordered, and verified current allergies; name and call back number provided for further questions should they arise Rockwell Alexandria RN Navigator Cardiac Imaging [facility] [phone] office [phone] cell  100mg  atenolol 2 hr prior to scan Pt will reach out to tolia's office for antianxiety med for claustrophobia 

## 2021-04-17 ENCOUNTER — Telehealth: Payer: Self-pay

## 2021-04-17 ENCOUNTER — Other Ambulatory Visit: Payer: Self-pay | Admitting: Cardiology

## 2021-04-17 ENCOUNTER — Other Ambulatory Visit: Payer: Self-pay

## 2021-04-17 DIAGNOSIS — F419 Anxiety disorder, unspecified: Secondary | ICD-10-CM

## 2021-04-17 MED ORDER — ALPRAZOLAM ER 0.5 MG PO TB24: 0.5000 mg | ORAL_TABLET | Freq: Every day | ORAL | 0 refills | Status: AC | PRN

## 2021-04-17 MED ORDER — ALPRAZOLAM ER 0.5 MG PO TB24: 0.5000 mg | ORAL_TABLET | Freq: Every day | ORAL | 0 refills | Status: DC | PRN

## 2021-04-17 NOTE — Telephone Encounter (Signed)
Prescriptions for Xanax sent to his pharmacy.  Please make sure the patient is aware that he cannot drive or operate machinery while using the medication.

## 2021-04-17 NOTE — Telephone Encounter (Signed)
Spoke to patient he said he would find someone to drive him to his CT for him to be able to take whatever medication you will prescribe for his anxiety.

## 2021-04-18 ENCOUNTER — Emergency Department (HOSPITAL_COMMUNITY)
Admission: EM | Admit: 2021-04-18 | Discharge: 2021-04-18 | Disposition: A | Discharge status: Home / Self Care | Payer: 59 | Attending: Emergency Medicine | Admitting: Emergency Medicine

## 2021-04-18 ENCOUNTER — Encounter (HOSPITAL_COMMUNITY): Payer: Self-pay | Admitting: Emergency Medicine

## 2021-04-18 ENCOUNTER — Emergency Department (HOSPITAL_COMMUNITY): Payer: 59

## 2021-04-18 ENCOUNTER — Other Ambulatory Visit (HOSPITAL_COMMUNITY): Payer: 59

## 2021-04-18 ENCOUNTER — Emergency Department (HOSPITAL_COMMUNITY)
Admission: RE | Admit: 2021-04-18 | Discharge: 2021-04-18 | Disposition: A | Discharge status: Home / Self Care | Payer: 59 | Source: Ambulatory Visit | Attending: Cardiology | Admitting: Cardiology

## 2021-04-18 ENCOUNTER — Other Ambulatory Visit: Payer: Self-pay

## 2021-04-18 DIAGNOSIS — Z8616 Personal history of COVID-19: Secondary | ICD-10-CM | POA: Insufficient documentation

## 2021-04-18 DIAGNOSIS — Z91010 Allergy to peanuts: Secondary | ICD-10-CM | POA: Insufficient documentation

## 2021-04-18 DIAGNOSIS — Z87891 Personal history of nicotine dependence: Secondary | ICD-10-CM | POA: Insufficient documentation

## 2021-04-18 DIAGNOSIS — I2510 Atherosclerotic heart disease of native coronary artery without angina pectoris: Secondary | ICD-10-CM | POA: Insufficient documentation

## 2021-04-18 DIAGNOSIS — I10 Essential (primary) hypertension: Secondary | ICD-10-CM | POA: Insufficient documentation

## 2021-04-18 DIAGNOSIS — J45909 Unspecified asthma, uncomplicated: Secondary | ICD-10-CM | POA: Diagnosis not present

## 2021-04-18 DIAGNOSIS — J069 Acute upper respiratory infection, unspecified: Secondary | ICD-10-CM | POA: Diagnosis not present

## 2021-04-18 DIAGNOSIS — R079 Chest pain, unspecified: Secondary | ICD-10-CM | POA: Insufficient documentation

## 2021-04-18 DIAGNOSIS — Z7982 Long term (current) use of aspirin: Secondary | ICD-10-CM | POA: Diagnosis not present

## 2021-04-18 DIAGNOSIS — Z79899 Other long term (current) drug therapy: Secondary | ICD-10-CM | POA: Insufficient documentation

## 2021-04-18 DIAGNOSIS — R059 Cough, unspecified: Secondary | ICD-10-CM | POA: Diagnosis present

## 2021-04-18 MED ORDER — METOPROLOL TARTRATE 5 MG/5ML IV SOLN
INTRAVENOUS | Status: AC
  Filled 2021-04-18: qty 10

## 2021-04-18 MED ORDER — METOPROLOL TARTRATE 5 MG/5ML IV SOLN
10.0000 mg | INTRAVENOUS | Status: DC | PRN
  Administered 2021-04-18: 10 mg via INTRAVENOUS

## 2021-04-18 MED ORDER — NITROGLYCERIN 0.4 MG SL SUBL: 0.8000 mg | SUBLINGUAL_TABLET | Freq: Once | SUBLINGUAL | Status: AC

## 2021-04-18 MED ORDER — IOHEXOL 350 MG/ML SOLN
100.0000 mL | Freq: Once | INTRAVENOUS | Status: AC | PRN
  Administered 2021-04-18: 100 mL via INTRAVENOUS

## 2021-04-18 MED ORDER — NITROGLYCERIN 0.4 MG SL SUBL
SUBLINGUAL_TABLET | SUBLINGUAL | Status: AC
  Administered 2021-04-18: 0.8 mg via SUBLINGUAL
  Filled 2021-04-18: qty 2

## 2021-04-18 NOTE — ED Triage Notes (Addendum)
"  I've been coughing all night."  I suppose to see Card at 8:30am this morning and take some medications for it but "I don't know if I should take them."
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
## 2021-04-18 NOTE — Telephone Encounter (Signed)
Patient is aware 

## 2021-04-18 NOTE — Discharge Instructions (Signed)
Follow up in your MyChart for your COVID test results. Coricidin HBP as needed as directed for symptom relief.

## 2021-04-19 ENCOUNTER — Other Ambulatory Visit: Payer: Self-pay | Admitting: Cardiology

## 2021-04-19 DIAGNOSIS — R931 Abnormal findings on diagnostic imaging of heart and coronary circulation: Secondary | ICD-10-CM

## 2021-04-22 NOTE — Progress Notes (Signed)
Pt called back, tara spoke to pt regarding results.

## 2021-04-22 NOTE — Progress Notes (Signed)
Called pt someone else answered informed her to let the pt know to give [place] a call back

## 2021-04-26 ENCOUNTER — Ambulatory Visit: Payer: 59 | Admitting: Cardiology

## 2021-05-21 NOTE — Progress Notes (Signed)
Attempted to call pt, no answer. Left vm requesting call back.

## 2021-05-21 NOTE — Progress Notes (Signed)
Patient called back, I have discussed results with him. Patient stated that during those times, he was sleeping. Patient appointment date and time has been confirmed with patient.

## 2021-06-12 ENCOUNTER — Ambulatory Visit: Payer: 59 | Admitting: Cardiology

## 2021-07-10 ENCOUNTER — Ambulatory Visit: Payer: 59 | Admitting: Cardiology

## 2021-07-10 ENCOUNTER — Encounter: Payer: Self-pay | Admitting: Cardiology

## 2021-07-10 ENCOUNTER — Other Ambulatory Visit: Payer: Self-pay

## 2021-07-10 VITALS — BP 127/82 | HR 86 | Resp 16 | Ht 71.0 in | Wt 350.0 lb

## 2021-07-10 DIAGNOSIS — Z87891 Personal history of nicotine dependence: Secondary | ICD-10-CM

## 2021-07-10 DIAGNOSIS — Z87898 Personal history of other specified conditions: Secondary | ICD-10-CM

## 2021-07-10 DIAGNOSIS — R002 Palpitations: Secondary | ICD-10-CM

## 2021-07-10 DIAGNOSIS — R931 Abnormal findings on diagnostic imaging of heart and coronary circulation: Secondary | ICD-10-CM

## 2021-07-10 DIAGNOSIS — Z8616 Personal history of COVID-19: Secondary | ICD-10-CM

## 2021-07-10 DIAGNOSIS — F1491 Cocaine use, unspecified, in remission: Secondary | ICD-10-CM

## 2021-07-10 DIAGNOSIS — R072 Precordial pain: Secondary | ICD-10-CM

## 2021-07-10 DIAGNOSIS — Z8249 Family history of ischemic heart disease and other diseases of the circulatory system: Secondary | ICD-10-CM

## 2021-07-10 DIAGNOSIS — Z6841 Body Mass Index (BMI) 40.0 and over, adult: Secondary | ICD-10-CM
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
## 2021-08-09 ENCOUNTER — Other Ambulatory Visit: Payer: Self-pay | Admitting: Gastroenterology

## 2021-08-09 DIAGNOSIS — R109 Unspecified abdominal pain: Secondary | ICD-10-CM

## 2021-08-13 ENCOUNTER — Ambulatory Visit (HOSPITAL_COMMUNITY): Admission: RE | Admit: 2021-08-13 | Payer: 59 | Source: Ambulatory Visit

## 2021-08-13 ENCOUNTER — Encounter (HOSPITAL_COMMUNITY): Payer: Self-pay

## 2021-08-18 NOTE — Addendum Note (Signed)
Encounter addended by: Novella Olive on: 08/18/2021 1:08 PM  Actions taken: Letter saved

## 2021-09-04 ENCOUNTER — Ambulatory Visit (INDEPENDENT_AMBULATORY_CARE_PROVIDER_SITE_OTHER): Payer: 59 | Admitting: Orthopedic Surgery

## 2021-09-04 ENCOUNTER — Encounter: Payer: Self-pay | Admitting: Orthopedic Surgery

## 2021-09-04 ENCOUNTER — Other Ambulatory Visit: Payer: Self-pay

## 2021-09-04 VITALS — Ht 71.0 in | Wt 350.0 lb

## 2021-09-04 DIAGNOSIS — M25562 Pain in left knee: Secondary | ICD-10-CM
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
## 2021-09-08 ENCOUNTER — Encounter: Payer: Self-pay | Admitting: Orthopedic Surgery

## 2021-09-24 ENCOUNTER — Other Ambulatory Visit: Payer: Self-pay

## 2021-09-24 ENCOUNTER — Ambulatory Visit (HOSPITAL_COMMUNITY)
Admission: RE | Admit: 2021-09-24 | Discharge: 2021-09-24 | Disposition: A | Discharge status: Home / Self Care | Payer: 59 | Source: Ambulatory Visit | Attending: Gastroenterology | Admitting: Gastroenterology

## 2021-09-24 DIAGNOSIS — R109 Unspecified abdominal pain: Secondary | ICD-10-CM | POA: Insufficient documentation

## 2021-10-08 ENCOUNTER — Ambulatory Visit (HOSPITAL_COMMUNITY): Admission: RE | Admit: 2021-10-08 | Payer: 59 | Source: Ambulatory Visit

## 2021-10-11 ENCOUNTER — Ambulatory Visit: Payer: 59 | Admitting: Orthopedic Surgery

## 2021-10-14 ENCOUNTER — Ambulatory Visit (HOSPITAL_COMMUNITY): Admission: RE | Admit: 2021-10-14 | Payer: 59 | Source: Ambulatory Visit

## 2021-10-15 ENCOUNTER — Other Ambulatory Visit: Payer: Self-pay | Admitting: Internal Medicine

## 2021-11-05 ENCOUNTER — Other Ambulatory Visit (HOSPITAL_COMMUNITY): Payer: Self-pay | Admitting: Gastroenterology

## 2021-11-05 DIAGNOSIS — K760 Fatty (change of) liver, not elsewhere classified: Secondary | ICD-10-CM

## 2021-11-15 ENCOUNTER — Encounter (HOSPITAL_COMMUNITY): Payer: Self-pay

## 2021-11-15 ENCOUNTER — Ambulatory Visit (HOSPITAL_COMMUNITY): Admission: RE | Admit: 2021-11-15 | Payer: 59 | Source: Ambulatory Visit

## 2021-11-29 ENCOUNTER — Encounter (HOSPITAL_COMMUNITY): Payer: Self-pay | Admitting: *Deleted

## 2021-11-29 ENCOUNTER — Other Ambulatory Visit: Payer: Self-pay

## 2021-11-29 ENCOUNTER — Ambulatory Visit (HOSPITAL_COMMUNITY)
Admission: EM | Admit: 2021-11-29 | Discharge: 2021-11-29 | Disposition: A | Discharge status: Home / Self Care | Payer: 59 | Attending: Urgent Care | Admitting: Urgent Care

## 2021-11-29 DIAGNOSIS — Z202 Contact with and (suspected) exposure to infections with a predominantly sexual mode of transmission: Secondary | ICD-10-CM | POA: Diagnosis not present

## 2021-11-29 DIAGNOSIS — K0889 Other specified disorders of teeth and supporting structures: Secondary | ICD-10-CM | POA: Insufficient documentation

## 2021-11-29 DIAGNOSIS — K047 Periapical abscess without sinus: Secondary | ICD-10-CM | POA: Diagnosis not present

## 2021-11-29 LAB — HIV ANTIBODY (ROUTINE TESTING W REFLEX): HIV Screen 4th Generation wRfx: NONREACTIVE

## 2021-11-29 MED ORDER — NAPROXEN 500 MG PO TABS: 500.0000 mg | ORAL_TABLET | Freq: Two times a day (BID) | ORAL | 0 refills | Status: DC

## 2021-11-29 MED ORDER — METRONIDAZOLE 500 MG PO TABS: 500.0000 mg | ORAL_TABLET | Freq: Two times a day (BID) | ORAL | 0 refills | Status: DC

## 2021-11-29 MED ORDER — AMOXICILLIN 875 MG PO TABS: 875.0000 mg | ORAL_TABLET | Freq: Two times a day (BID) | ORAL | 0 refills | Status: DC

## 2021-11-29 NOTE — ED Triage Notes (Signed)
Pt reports he received call from partner to report STD. Pt also reports a ear infection.

## 2021-11-29 NOTE — Discharge Instructions (Addendum)
Avoid all forms of sexual intercourse (oral, vaginal, anal) for the next 7 days to avoid spreading/reinfecting or at least until we can see what kinds of infection results are positive. Return if symptoms worsen/do not resolve, you develop fever, abdominal pain, blood in your urine, or are re-exposed to a sexually transmitted infection (STI).     Make sure you schedule an appointment with a dentist/dental surgeon as soon as possible.  You may try some of the resources below.     [facility] [place] Address: [address] [place], [place], [place] [address] Phone: [phone]  [facility] [phone] extension [phone] [address]  Dr. Lawrence Marseilles [phone] [address].  [place] [phone] [address] [facility] [place].  [facility] [phone] extension [phone], [place], [place], [address] First come first serve for the first 10 clients.  May do simple extractions only, no wisdom teeth or surgery.  You may try the second for Thursday of the month starting at 6:30 AM.  [facility] You may call the school to see if they are still helping to provide dental care for emergent cases.
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
## 2021-11-30 LAB — RPR: RPR Ser Ql: NONREACTIVE

## 2021-12-02 LAB — CYTOLOGY, (ORAL, ANAL, URETHRAL) ANCILLARY ONLY
Chlamydia: NEGATIVE
Comment: NEGATIVE
Comment: NEGATIVE
Comment: NORMAL
Neisseria Gonorrhea: NEGATIVE
Trichomonas: NEGATIVE

## 2021-12-26 ENCOUNTER — Other Ambulatory Visit: Payer: Self-pay

## 2021-12-26 ENCOUNTER — Emergency Department (HOSPITAL_COMMUNITY)
Admission: EM | Admit: 2021-12-26 | Discharge: 2021-12-27 | Disposition: A | Discharge status: Home / Self Care | Payer: 59 | Attending: Emergency Medicine | Admitting: Emergency Medicine

## 2021-12-26 DIAGNOSIS — R300 Dysuria: Secondary | ICD-10-CM | POA: Diagnosis not present

## 2021-12-26 DIAGNOSIS — Z5321 Procedure and treatment not carried out due to patient leaving prior to being seen by health care provider: Secondary | ICD-10-CM | POA: Diagnosis not present

## 2021-12-26 LAB — URINALYSIS, ROUTINE W REFLEX MICROSCOPIC
Bilirubin Urine: NEGATIVE
Glucose, UA: NEGATIVE mg/dL
Hgb urine dipstick: NEGATIVE
Ketones, ur: NEGATIVE mg/dL
Nitrite: NEGATIVE
Protein, ur: NEGATIVE mg/dL
Specific Gravity, Urine: 1.023 (ref 1.005–1.030)
pH: 5 (ref 5.0–8.0)

## 2021-12-26 NOTE — ED Notes (Signed)
Pt stepped outside.  

## 2021-12-26 NOTE — ED Triage Notes (Signed)
Pt reports burning with urination x  2 weeks. Went to UC last week with multiple negative tests for UTI and STDs but was started on abx and completed the course but symptoms have not resolved.

## 2021-12-27 ENCOUNTER — Encounter (HOSPITAL_COMMUNITY): Payer: Self-pay

## 2021-12-27 ENCOUNTER — Ambulatory Visit (INDEPENDENT_AMBULATORY_CARE_PROVIDER_SITE_OTHER)
Admission: EM | Admit: 2021-12-27 | Discharge: 2021-12-27 | Disposition: A | Discharge status: Home / Self Care | Payer: 59 | Source: Home / Self Care | Attending: Family Medicine | Admitting: Family Medicine

## 2021-12-27 DIAGNOSIS — R300 Dysuria: Secondary | ICD-10-CM

## 2021-12-27 MED ORDER — CIPROFLOXACIN HCL 500 MG PO TABS: 500.0000 mg | ORAL_TABLET | Freq: Two times a day (BID) | ORAL | 0 refills | Status: AC

## 2021-12-27 NOTE — ED Notes (Signed)
Pt called 4x no answer  
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
## 2021-12-27 NOTE — ED Triage Notes (Signed)
Pt c/o burning on urination with frequency x2 wk. States neg for STD's and gave a urine at the ED last night.

## 2021-12-27 NOTE — Discharge Instructions (Addendum)
Take cipro 500 mg 1 tab twice daily for 10 days, for poss urinary infection/prostate infection.  Our staff will call you if tests are positive that need a different treatment.

## 2021-12-28 LAB — URINE CULTURE: Culture: NO GROWTH

## 2021-12-30 LAB — CYTOLOGY, (ORAL, ANAL, URETHRAL) ANCILLARY ONLY
Chlamydia: NEGATIVE
Comment: NEGATIVE
Comment: NEGATIVE
Comment: NORMAL
Neisseria Gonorrhea: NEGATIVE
Trichomonas: NEGATIVE

## 2022-01-10 ENCOUNTER — Ambulatory Visit: Payer: 59 | Admitting: Cardiology

## 2022-02-07 ENCOUNTER — Encounter (HOSPITAL_COMMUNITY): Payer: Self-pay

## 2022-02-07 ENCOUNTER — Other Ambulatory Visit: Payer: Self-pay

## 2022-02-07 ENCOUNTER — Ambulatory Visit (HOSPITAL_COMMUNITY)
Admission: EM | Admit: 2022-02-07 | Discharge: 2022-02-07 | Disposition: A | Discharge status: Home / Self Care | Payer: 59 | Attending: Physician Assistant | Admitting: Physician Assistant

## 2022-02-07 DIAGNOSIS — J069 Acute upper respiratory infection, unspecified: Secondary | ICD-10-CM | POA: Insufficient documentation

## 2022-02-07 DIAGNOSIS — Z20822 Contact with and (suspected) exposure to covid-19: Secondary | ICD-10-CM | POA: Diagnosis not present

## 2022-02-07 DIAGNOSIS — R059 Cough, unspecified: Secondary | ICD-10-CM | POA: Diagnosis present

## 2022-02-07 MED ORDER — ACETAMINOPHEN 325 MG PO TABS
ORAL_TABLET | ORAL | Status: AC
  Filled 2022-02-07: qty 2

## 2022-02-07 MED ORDER — ACETAMINOPHEN 325 MG PO TABS
650.0000 mg | ORAL_TABLET | Freq: Four times a day (QID) | ORAL | Status: DC | PRN
  Administered 2022-02-07: 650 mg via ORAL

## 2022-02-07 NOTE — Discharge Instructions (Signed)
Tylenol every 4 hours.  Covid test is pending

## 2022-02-07 NOTE — ED Triage Notes (Signed)
Pt presents to the office for fever,sore throat., and cough. He is using his inhaler.

## 2022-02-08 LAB — SARS CORONAVIRUS 2 (TAT 6-24 HRS): SARS Coronavirus 2: NEGATIVE

## 2022-02-09 ENCOUNTER — Other Ambulatory Visit: Payer: Self-pay

## 2022-02-09 ENCOUNTER — Emergency Department (HOSPITAL_COMMUNITY)
Admission: EM | Admit: 2022-02-09 | Discharge: 2022-02-09 | Disposition: A | Discharge status: Home / Self Care | Payer: 59 | Attending: Emergency Medicine | Admitting: Emergency Medicine

## 2022-02-09 ENCOUNTER — Encounter (HOSPITAL_COMMUNITY): Payer: Self-pay | Admitting: Emergency Medicine

## 2022-02-09 ENCOUNTER — Emergency Department (HOSPITAL_COMMUNITY): Payer: 59

## 2022-02-09 DIAGNOSIS — Z79899 Other long term (current) drug therapy: Secondary | ICD-10-CM | POA: Insufficient documentation

## 2022-02-09 DIAGNOSIS — I10 Essential (primary) hypertension: Secondary | ICD-10-CM | POA: Insufficient documentation

## 2022-02-09 DIAGNOSIS — J45909 Unspecified asthma, uncomplicated: Secondary | ICD-10-CM | POA: Diagnosis not present

## 2022-02-09 DIAGNOSIS — R0981 Nasal congestion: Secondary | ICD-10-CM | POA: Diagnosis not present

## 2022-02-09 DIAGNOSIS — Z20822 Contact with and (suspected) exposure to covid-19: Secondary | ICD-10-CM | POA: Insufficient documentation

## 2022-02-09 DIAGNOSIS — R400 Somnolence: Secondary | ICD-10-CM | POA: Diagnosis not present

## 2022-02-09 DIAGNOSIS — R197 Diarrhea, unspecified: Secondary | ICD-10-CM | POA: Insufficient documentation

## 2022-02-09 DIAGNOSIS — R5383 Other fatigue: Secondary | ICD-10-CM | POA: Diagnosis present

## 2022-02-09 DIAGNOSIS — R52 Pain, unspecified: Secondary | ICD-10-CM

## 2022-02-09 DIAGNOSIS — Z7982 Long term (current) use of aspirin: Secondary | ICD-10-CM | POA: Insufficient documentation

## 2022-02-09 DIAGNOSIS — Z91010 Allergy to peanuts: Secondary | ICD-10-CM | POA: Insufficient documentation

## 2022-02-09 DIAGNOSIS — R519 Headache, unspecified: Secondary | ICD-10-CM | POA: Diagnosis not present

## 2022-02-09 DIAGNOSIS — M791 Myalgia, unspecified site: Secondary | ICD-10-CM | POA: Diagnosis not present

## 2022-02-09 LAB — URINALYSIS, ROUTINE W REFLEX MICROSCOPIC
Bilirubin Urine: NEGATIVE
Glucose, UA: NEGATIVE mg/dL
Hgb urine dipstick: NEGATIVE
Ketones, ur: NEGATIVE mg/dL
Leukocytes,Ua: NEGATIVE
Nitrite: NEGATIVE
Protein, ur: NEGATIVE mg/dL
Specific Gravity, Urine: 1.02 (ref 1.005–1.030)
pH: 7 (ref 5.0–8.0)

## 2022-02-09 LAB — CBC WITH DIFFERENTIAL/PLATELET
Abs Immature Granulocytes: 0.01 10*3/uL (ref 0.00–0.07)
Basophils Absolute: 0 10*3/uL (ref 0.0–0.1)
Basophils Relative: 1 %
Eosinophils Absolute: 0.2 10*3/uL (ref 0.0–0.5)
Eosinophils Relative: 4 %
HCT: 43.3 % (ref 39.0–52.0)
Hemoglobin: 14.3 g/dL (ref 13.0–17.0)
Immature Granulocytes: 0 %
Lymphocytes Relative: 26 %
Lymphs Abs: 1.1 10*3/uL (ref 0.7–4.0)
MCH: 32.9 pg (ref 26.0–34.0)
MCHC: 33 g/dL (ref 30.0–36.0)
MCV: 99.8 fL (ref 80.0–100.0)
Monocytes Absolute: 0.5 10*3/uL (ref 0.1–1.0)
Monocytes Relative: 11 %
Neutro Abs: 2.4 10*3/uL (ref 1.7–7.7)
Neutrophils Relative %: 58 %
Platelets: 197 10*3/uL (ref 150–400)
RBC: 4.34 MIL/uL (ref 4.22–5.81)
RDW: 12.8 % (ref 11.5–15.5)
WBC: 4.1 10*3/uL (ref 4.0–10.5)
nRBC: 0 % (ref 0.0–0.2)

## 2022-02-09 LAB — COMPREHENSIVE METABOLIC PANEL
ALT: 40 U/L (ref 0–44)
AST: 30 U/L (ref 15–41)
Albumin: 3.9 g/dL (ref 3.5–5.0)
Alkaline Phosphatase: 47 U/L (ref 38–126)
Anion gap: 10 (ref 5–15)
BUN: 9 mg/dL (ref 6–20)
CO2: 27 mmol/L (ref 22–32)
Calcium: 9.2 mg/dL (ref 8.9–10.3)
Chloride: 100 mmol/L (ref 98–111)
Creatinine, Ser: 0.94 mg/dL (ref 0.61–1.24)
GFR, Estimated: >60 mL/min (ref >60)
Glucose, Bld: 95 mg/dL (ref 70–99)
Potassium: 4.1 mmol/L (ref 3.5–5.1)
Sodium: 137 mmol/L (ref 135–145)
Total Bilirubin: 0.5 mg/dL (ref 0.3–1.2)
Total Protein: 7.3 g/dL (ref 6.5–8.1)

## 2022-02-09 LAB — CBG MONITORING, ED: Glucose-Capillary: 101 mg/dL — ABNORMAL HIGH (ref 70–99)

## 2022-02-09 LAB — RESP PANEL BY RT-PCR (FLU A&B, COVID) ARPGX2
Influenza A by PCR: NEGATIVE
Influenza B by PCR: NEGATIVE
SARS Coronavirus 2 by RT PCR: NEGATIVE

## 2022-02-09 LAB — MAGNESIUM: Magnesium: 2.1 mg/dL (ref 1.7–2.4)

## 2022-02-09 MED ORDER — ACETAMINOPHEN 325 MG PO TABS
650.0000 mg | ORAL_TABLET | Freq: Once | ORAL | Status: AC
  Administered 2022-02-09: 650 mg via ORAL
  Filled 2022-02-09: qty 2

## 2022-02-09 MED ORDER — KETOROLAC TROMETHAMINE 15 MG/ML IJ SOLN
15.0000 mg | Freq: Once | INTRAMUSCULAR | Status: AC
  Administered 2022-02-09: 15 mg via INTRAVENOUS
  Filled 2022-02-09: qty 1

## 2022-02-09 MED ORDER — LACTATED RINGERS IV BOLUS
1000.0000 mL | Freq: Once | INTRAVENOUS | Status: AC
  Administered 2022-02-09: 1000 mL via INTRAVENOUS

## 2022-02-09 NOTE — Discharge Instructions (Signed)
Continue supportive care with ibuprofen, Tylenol, and plenty of fluids.  Return to the emergency department if you experience worsening severity of symptoms.
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
## 2022-02-09 NOTE — ED Triage Notes (Signed)
Pt reports headache, dizziness, SOB, body aches, and chills x 2 days.  Seen at [facility] for fever, sore throat, and cough 2 days ago and states those have resolved.  Negative for COVID at [facility].
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
## 2022-03-12 ENCOUNTER — Ambulatory Visit: Payer: 59 | Attending: Family Medicine | Admitting: Family Medicine

## 2022-03-12 ENCOUNTER — Other Ambulatory Visit: Payer: Self-pay

## 2022-03-12 ENCOUNTER — Encounter: Payer: Self-pay | Admitting: Family Medicine

## 2022-03-12 DIAGNOSIS — R29818 Other symptoms and signs involving the nervous system: Secondary | ICD-10-CM | POA: Diagnosis not present

## 2022-03-12 DIAGNOSIS — M1712 Unilateral primary osteoarthritis, left knee: Secondary | ICD-10-CM

## 2022-03-12 DIAGNOSIS — I10 Essential (primary) hypertension: Secondary | ICD-10-CM

## 2022-03-12 DIAGNOSIS — Z131 Encounter for screening for diabetes mellitus: Secondary | ICD-10-CM

## 2022-03-12 DIAGNOSIS — R911 Solitary pulmonary nodule: Secondary | ICD-10-CM

## 2022-03-12 DIAGNOSIS — Z1159 Encounter for screening for other viral diseases: Secondary | ICD-10-CM

## 2022-03-12 MED ORDER — ATENOLOL 50 MG PO TABS: 50.0000 mg | ORAL_TABLET | Freq: Every day | ORAL | 3 refills | Status: DC

## 2022-03-12 MED ORDER — ALBUTEROL SULFATE HFA 108 (90 BASE) MCG/ACT IN AERS: 1.0000 | INHALATION_SPRAY | Freq: Four times a day (QID) | RESPIRATORY_TRACT | 0 refills | Status: DC | PRN

## 2022-03-12 MED ORDER — AMLODIPINE BESYLATE 5 MG PO TABS: 5.0000 mg | ORAL_TABLET | Freq: Every day | ORAL | 3 refills | Status: DC
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
## 2022-03-12 NOTE — Progress Notes (Signed)
Needs sleep study ?Ct of lungs ? ?

## 2022-03-12 NOTE — Patient Instructions (Signed)

## 2022-03-13 ENCOUNTER — Other Ambulatory Visit: Payer: 59

## 2022-03-20 ENCOUNTER — Other Ambulatory Visit: Payer: 59

## 2022-03-26 ENCOUNTER — Other Ambulatory Visit: Payer: 59

## 2022-03-26 ENCOUNTER — Ambulatory Visit: Payer: 59 | Attending: Family Medicine

## 2022-03-26 DIAGNOSIS — Z1159 Encounter for screening for other viral diseases: Secondary | ICD-10-CM

## 2022-03-26 DIAGNOSIS — Z131 Encounter for screening for diabetes mellitus: Secondary | ICD-10-CM

## 2022-03-26 DIAGNOSIS — I10 Essential (primary) hypertension: Secondary | ICD-10-CM

## 2022-03-28 LAB — LP+NON-HDL CHOLESTEROL
Cholesterol, Total: 145 mg/dL (ref 100–199)
HDL: 41 mg/dL (ref >39)
LDL Chol Calc (NIH): 80 mg/dL (ref 0–99)
Total Non-HDL-Chol (LDL+VLDL): 104 mg/dL (ref 0–129)
Triglycerides: 137 mg/dL (ref 0–149)
VLDL Cholesterol Cal: 24 mg/dL (ref 5–40)

## 2022-03-28 LAB — HCV AB W REFLEX TO QUANT PCR: HCV Ab: NONREACTIVE

## 2022-03-28 LAB — HCV INTERPRETATION

## 2022-03-28 LAB — HEMOGLOBIN A1C
Est. average glucose Bld gHb Est-mCnc: 131 mg/dL
Hgb A1c MFr Bld: 6.2 % — ABNORMAL HIGH (ref 4.8–5.6)

## 2022-04-09 ENCOUNTER — Ambulatory Visit (HOSPITAL_BASED_OUTPATIENT_CLINIC_OR_DEPARTMENT_OTHER): Payer: 59 | Attending: Family Medicine | Admitting: Internal Medicine

## 2022-04-09 DIAGNOSIS — G4733 Obstructive sleep apnea (adult) (pediatric): Secondary | ICD-10-CM | POA: Insufficient documentation

## 2022-04-09 DIAGNOSIS — Z6841 Body Mass Index (BMI) 40.0 and over, adult: Secondary | ICD-10-CM | POA: Diagnosis not present

## 2022-04-09 DIAGNOSIS — R5383 Other fatigue: Secondary | ICD-10-CM | POA: Diagnosis not present

## 2022-04-09 DIAGNOSIS — I10 Essential (primary) hypertension: Secondary | ICD-10-CM | POA: Insufficient documentation

## 2022-04-09 DIAGNOSIS — R0683 Snoring: Secondary | ICD-10-CM | POA: Insufficient documentation

## 2022-04-09 DIAGNOSIS — R29818 Other symptoms and signs involving the nervous system: Secondary | ICD-10-CM

## 2022-04-09 DIAGNOSIS — E669 Obesity, unspecified: Secondary | ICD-10-CM | POA: Insufficient documentation

## 2022-04-19 DIAGNOSIS — R29818 Other symptoms and signs involving the nervous system: Secondary | ICD-10-CM | POA: Diagnosis not present

## 2022-04-19 NOTE — Procedures (Signed)
? ? ? ?Patient Name: Kristopher Dunn, Kristopher Dunn ?Study Date: 04/09/2022 ?Gender: Male ?D.O.B: [birth year] ?Age (years): 62 ?Referring Provider: Jaclyn Shaggy ?Height (inches): 71 ?Interpreting Physician: Jetty Duhamel MD, ABSM ?Weight (lbs): 345 ?RPSGT: Ulyess Mort ?BMI: 48 ?MRN: [id number] ?Neck Size: 17.00 ? ?CLINICAL INFORMATION ?Sleep Study Type: NPSG ?Indication for sleep study: Excessive Daytime Sleepiness, Fatigue, Hypertension, Obesity, OSA, Snoring ?Epworth Sleepiness Score: 6 ? ?SLEEP STUDY TECHNIQUE ?As per the AASM Manual for the Scoring of Sleep and Associated Events v2.3 (April 2016) with a hypopnea requiring 4% desaturations. ? ?The channels recorded and monitored were frontal, central and occipital EEG, electrooculogram (EOG), submentalis EMG (chin), nasal and oral airflow, thoracic and abdominal wall motion, anterior tibialis EMG, snore microphone, electrocardiogram, and pulse oximetry. ? ?MEDICATIONS ?Medications self-administered by patient taken the night of the study : N/A ? ?SLEEP ARCHITECTURE ?The study was initiated at 10:38:58 PM and ended at 4:55:29 AM. ? ?Sleep onset time was 48.7 minutes and the sleep efficiency was 66.7%%. The total sleep time was 251 minutes. ? ?Stage REM latency was 52.5 minutes. ? ?The patient spent 27.9%% of the night in stage N1 sleep, 52.0%% in stage N2 sleep, 0.0%% in stage N3 and 20.1% in REM. ? ?Alpha intrusion was absent. ? ?Supine sleep was 61.19%. ? ?RESPIRATORY PARAMETERS ?The overall apnea/hypopnea index (AHI) was 106.4 per hour. There were 437 total apneas, including 153 obstructive, 268 central and 16 mixed apneas. There were 8 hypopneas and 1 RERAs. ? ?The AHI during Stage REM sleep was 87.9 per hour. ? ?AHI while supine was 107.4 per hour. ? ?The mean oxygen saturation was 89.9%. The minimum SpO2 during sleep was 61.0%. ? ?moderate snoring was noted during this study. ? ?CARDIAC DATA ?The 2 lead EKG demonstrated sinus rhythm. The mean heart rate was 63.3 beats  per minute. Other EKG findings include: None. ? ?LEG MOVEMENT DATA ?The total PLMS were 0 with a resulting PLMS index of 0.0. Associated arousal with leg movement index was 0.0 . ? ?IMPRESSIONS ?- Severe obstructive sleep apnea occurred during this study (AHI = 106.4/h). ?- Markedly fragmented sleep with insufficient early sleep to meet protocol requirement for split CPAP titration. ?- Severe central sleep apnea occurred during this study (CAI = 64.1/h). Most of this may be incompletely recorded OSA. ?- Severe oxygen desaturation was noted during this study (Min O2 = 61.0%). Mean 89.9%. ?- The patient snored with moderate snoring volume. ?- No cardiac abnormalities were noted during this study. ?- Clinically significant periodic limb movements did not occur during sleep. No significant associated arousals. ? ?DIAGNOSIS ?- Obstructive Sleep Apnea (G47.33) ?- Central Sleep Apnea (G47.37) ?- Nocturnal Hypoxemia (G47.36) ? ?RECOMMENDATIONS ?- Suggest return for CPAP titration sleep study. This will document if BIPAP or supplemental oxygen may be needed.. ?- Be careful with alcohol, sedatives and other CNS depressants that may worsen sleep apnea and disrupt normal sleep architecture. ?- Sleep hygiene should be reviewed to assess factors that may improve sleep quality. ?- Weight management and regular exercise should be initiated or continued if appropriate. ? ?[Electronically signed] 04/19/2022 11:47 AM ? ?Jetty Duhamel MD, ABSM ?Diplomate, Biomedical engineer of Sleep Medicine ?NPI: [id number] ?  ? ? ? ? ? ? ? ? ? ? ? ? ? ? ? ? ? ? ? ? ? ?Kristopher Dunn ?Diplomate, Biomedical engineer of Sleep Medicine ? ?ELECTRONICALLY SIGNED ON:  04/19/2022, 11:43 AM ?[facility] ?PH: [phone]   FX: [phone] ?ACCREDITED BY THE AMERICAN ACADEMY OF  SLEEP MEDICINE ?

## 2022-05-13 ENCOUNTER — Telehealth: Payer: Self-pay | Admitting: Physician Assistant

## 2022-05-13 ENCOUNTER — Ambulatory Visit: Payer: 59 | Admitting: Physician Assistant

## 2022-05-13 VITALS — BP 127/89 | HR 84 | Temp 98.0°F

## 2022-05-13 DIAGNOSIS — Z6841 Body Mass Index (BMI) 40.0 and over, adult: Secondary | ICD-10-CM

## 2022-05-13 DIAGNOSIS — E559 Vitamin D deficiency, unspecified: Secondary | ICD-10-CM | POA: Diagnosis not present

## 2022-05-13 DIAGNOSIS — R7303 Prediabetes: Secondary | ICD-10-CM | POA: Diagnosis not present

## 2022-05-13 MED ORDER — METFORMIN HCL ER 500 MG PO TB24: 500.0000 mg | ORAL_TABLET | Freq: Every day | ORAL | 1 refills | Status: DC

## 2022-05-13 MED ORDER — ACCU-CHEK SOFTCLIX LANCETS MISC: 12 refills | Status: DC

## 2022-05-13 MED ORDER — ACCU-CHEK AVIVA PLUS VI STRP: ORAL_STRIP | 12 refills | Status: DC

## 2022-05-13 MED ORDER — ACCU-CHEK AVIVA PLUS W/DEVICE KIT: 1.0000 [IU] | PACK | Freq: Once | 0 refills | Status: AC

## 2022-05-13 NOTE — Patient Instructions (Signed)
You are going to start taking metformin once daily with breakfast.  I encourage you to check your blood sugar levels in the morning, keep a written log and have available for when you follow-up with Dr. Alvis Lemmings.  I encourage you to discontinue all sugary drinks, increase your water intake.  I encourage you to start taking 2000 units of vitamin D once daily, you will purchase this over-the-counter.  Please let us know if there is any else we can do for you.  Roney Jaffe, PA-C Physician Assistant [facility] [email or web address]   Prediabetes Eating Plan Prediabetes is a condition that causes blood sugar (glucose) levels to be higher than normal. This increases the risk for developing type 2 diabetes (type 2 diabetes mellitus). Working with a health care provider or nutrition specialist (dietitian) to make diet and lifestyle changes can help prevent the onset of diabetes. These changes may help you: Control your blood glucose levels. Improve your cholesterol levels. Manage your blood pressure. What are tips for following this plan? Reading food labels Read food labels to check the amount of fat, salt (sodium), and sugar in prepackaged foods. Avoid foods that have: Saturated fats. Trans fats. Added sugars. Avoid foods that have more than 300 milligrams (mg) of sodium per serving. Limit your sodium intake to less than 2,300 mg each day. Shopping Avoid buying pre-made and processed foods. Avoid buying drinks with added sugar. Cooking Cook with olive oil. Do not use butter, lard, or ghee. Bake, broil, grill, steam, or boil foods. Avoid frying. Meal planning  Work with your dietitian to create an eating plan that is right for you. This may include tracking how many calories you take in each day. Use a food diary, notebook, or mobile application to track what you eat at each meal. Consider following a Mediterranean diet. This  includes: Eating several servings of fresh fruits and vegetables each day. Eating fish at least twice a week. Eating one serving each day of whole grains, beans, nuts, and seeds. Using olive oil instead of other fats. Limiting alcohol. Limiting red meat. Using nonfat or low-fat dairy products. Consider following a plant-based diet. This includes dietary choices that focus on eating mostly vegetables and fruit, grains, beans, nuts, and seeds. If you have high blood pressure, you may need to limit your sodium intake or follow a diet such as the DASH (Dietary Approaches to Stop Hypertension) eating plan. The DASH diet aims to lower high blood pressure. Lifestyle Set weight loss goals with help from your health care team. It is recommended that most people with prediabetes lose 7% of their body weight. Exercise for at least 30 minutes 5 or more days a week. Attend a support group or seek support from a mental health counselor. Take over-the-counter and prescription medicines only as told by your health care provider. What foods are recommended? Fruits Berries. Bananas. Apples. Oranges. Grapes. Papaya. Mango. Pomegranate. Kiwi. Grapefruit. Cherries. Vegetables Lettuce. Spinach. Peas. Beets. Cauliflower. Cabbage. Broccoli. Carrots. Tomatoes. Squash. Eggplant. Herbs. Peppers. Onions. Cucumbers. Brussels sprouts. Grains Whole grains, such as whole-wheat or whole-grain breads, crackers, cereals, and pasta. Unsweetened oatmeal. Bulgur. Barley. Quinoa. Brown rice. Corn or whole-wheat flour tortillas or taco shells. Meats and other proteins Seafood. Poultry without skin. Lean cuts of pork and beef. Tofu. Eggs. Nuts. Beans. Dairy Low-fat or fat-free dairy products, such as yogurt, cottage cheese, and cheese. Beverages Water. Tea. Coffee. Sugar-free or diet soda. Seltzer water. Low-fat or nonfat milk. Milk alternatives, such as soy  or almond milk. Fats and oils Olive oil. Canola oil. Sunflower oil.  Grapeseed oil. Avocado. Walnuts. Sweets and desserts Sugar-free or low-fat pudding. Sugar-free or low-fat ice cream and other frozen treats. Seasonings and condiments Herbs. Sodium-free spices. Mustard. Relish. Low-salt, low-sugar ketchup. Low-salt, low-sugar barbecue sauce. Low-fat or fat-free mayonnaise. The items listed above may not be a complete list of recommended foods and beverages. Contact a dietitian for more information. What foods are not recommended? Fruits Fruits canned with syrup. Vegetables Canned vegetables. Frozen vegetables with butter or cream sauce. Grains Refined white flour and flour products, such as bread, pasta, snack foods, and cereals. Meats and other proteins Fatty cuts of meat. Poultry with skin. Breaded or fried meat. Processed meats. Dairy Full-fat yogurt, cheese, or milk. Beverages Sweetened drinks, such as iced tea and soda. Fats and oils Butter. Lard. Ghee. Sweets and desserts Baked goods, such as cake, cupcakes, pastries, cookies, and cheesecake. Seasonings and condiments Spice mixes with added salt. Ketchup. Barbecue sauce. Mayonnaise. The items listed above may not be a complete list of foods and beverages that are not recommended. Contact a dietitian for more information. Where to find more information American Diabetes Association: www.diabetes.org Summary You may need to make diet and lifestyle changes to help prevent the onset of diabetes. These changes can help you control blood sugar, improve cholesterol levels, and manage blood pressure. Set weight loss goals with help from your health care team. It is recommended that most people with prediabetes lose 7% of their body weight. Consider following a Mediterranean diet. This includes eating plenty of fresh fruits and vegetables, whole grains, beans, nuts, seeds, fish, and low-fat dairy, and using olive oil instead of other fats. This information is not intended to replace advice given to you  by your health care provider. Make sure you discuss any questions you have with your health care provider. Document Revised: 03/08/2020 Document Reviewed: 03/08/2020 Elsevier Patient Education  2023 [employer].

## 2022-05-13 NOTE — Progress Notes (Unsigned)
   Established Patient Office Visit  Subjective   Patient ID: Kristopher Dunn, male    DOB: 1981-08-15  Age: 41 y.o. MRN: [id number]  No chief complaint on file.   States that he is concerned about his blood sugar levels.  States that he had an A1C of 6.2 in Aprl 2023  States that he does not have supplies to check his BG at home, states that he went to his pain doctor and as told his BG was elevated  Sweet tea;    {History (Optional):23778}  ROS    Objective:     There were no vitals taken for this visit. {Vitals History (Optional):23777}  Physical Exam   No results found for any visits on 05/13/22.  {Labs (Optional):23779}  The 10-year ASCVD risk score (Arnett DK, et al., 2019) is: 7%    Assessment & Plan:   Problem List Items Addressed This Visit   None   No follow-ups on file.    Kasandra Knudsen Mayers, PA-C

## 2022-05-13 NOTE — Telephone Encounter (Signed)
Medication Refill - Medication: Probation officer does not make Blood Glucose Monitoring Suppl (ACCU-CHEK AVIVA PLUS) w/Device KIT. Caller requesting a new script accu chek guide  Has the patient contacted their pharmacy? Yes.    (Agent: If yes, when and what did the pharmacy advise?) Pharmacy was the caller   Preferred Pharmacy (with phone number or street name): \  [place] ([place] - [address]  [address], [place] [address]  Phone:  [phone]  Fax:  [phone]   Has the patient been seen for an appointment in the last year OR does the patient have an upcoming appointment? Yes.    Agent: Please be advised that RX refills may take up to 3 business days. We ask that you follow-up with your pharmacy.

## 2022-05-14 ENCOUNTER — Other Ambulatory Visit: Payer: Self-pay

## 2022-05-14 ENCOUNTER — Encounter: Payer: Self-pay | Admitting: Physician Assistant

## 2022-05-14 DIAGNOSIS — R7303 Prediabetes: Secondary | ICD-10-CM | POA: Insufficient documentation

## 2022-05-14 DIAGNOSIS — E559 Vitamin D deficiency, unspecified: Secondary | ICD-10-CM | POA: Insufficient documentation

## 2022-05-14 MED ORDER — ACCU-CHEK GUIDE W/DEVICE KIT: PACK | 0 refills | Status: DC

## 2022-05-14 MED ORDER — ACCU-CHEK GUIDE VI STRP: ORAL_STRIP | 12 refills | Status: DC

## 2022-05-14 NOTE — Telephone Encounter (Signed)
Correct meter has been sent to [facility].

## 2022-05-17 ENCOUNTER — Encounter (HOSPITAL_COMMUNITY): Payer: Self-pay | Admitting: Emergency Medicine

## 2022-05-17 ENCOUNTER — Emergency Department (HOSPITAL_COMMUNITY)
Admission: EM | Admit: 2022-05-17 | Discharge: 2022-05-17 | Disposition: A | Discharge status: Home / Self Care | Payer: 59 | Attending: Emergency Medicine | Admitting: Emergency Medicine

## 2022-05-17 ENCOUNTER — Other Ambulatory Visit: Payer: Self-pay

## 2022-05-17 DIAGNOSIS — Z91010 Allergy to peanuts: Secondary | ICD-10-CM | POA: Diagnosis not present

## 2022-05-17 DIAGNOSIS — J45909 Unspecified asthma, uncomplicated: Secondary | ICD-10-CM | POA: Insufficient documentation

## 2022-05-17 DIAGNOSIS — H6691 Otitis media, unspecified, right ear: Secondary | ICD-10-CM | POA: Diagnosis not present

## 2022-05-17 DIAGNOSIS — Z7984 Long term (current) use of oral hypoglycemic drugs: Secondary | ICD-10-CM | POA: Insufficient documentation

## 2022-05-17 DIAGNOSIS — I10 Essential (primary) hypertension: Secondary | ICD-10-CM | POA: Diagnosis not present

## 2022-05-17 DIAGNOSIS — Z79899 Other long term (current) drug therapy: Secondary | ICD-10-CM | POA: Insufficient documentation

## 2022-05-17 DIAGNOSIS — Z7982 Long term (current) use of aspirin: Secondary | ICD-10-CM | POA: Insufficient documentation

## 2022-05-17 DIAGNOSIS — H9201 Otalgia, right ear: Secondary | ICD-10-CM | POA: Diagnosis present

## 2022-05-17 DIAGNOSIS — E119 Type 2 diabetes mellitus without complications: Secondary | ICD-10-CM | POA: Diagnosis not present

## 2022-05-17 DIAGNOSIS — H6690 Otitis media, unspecified, unspecified ear: Secondary | ICD-10-CM

## 2022-05-17 MED ORDER — AMOXICILLIN-POT CLAVULANATE 875-125 MG PO TABS: 1.0000 | ORAL_TABLET | Freq: Two times a day (BID) | ORAL | 0 refills | Status: DC

## 2022-05-17 NOTE — Discharge Instructions (Addendum)
Take antibiotics twice daily for the next 7 days to treat ear infection. Make sure you complete all of your antibiotics even if your symptoms resolve. You can use motrin and tylenol to help with pain.  If symptoms are worsening please return for reevaluation.  Follow-up with your PCP if symptoms or not resolving.

## 2022-05-17 NOTE — ED Triage Notes (Signed)
Pt reported to ED with c/o severe pain and popping to right ear x3 days.

## 2022-05-17 NOTE — ED Provider Notes (Signed)
[facility] Provider Note   CSN: [id number] Arrival date & time: 05/17/22  0557     History  Chief Complaint  Patient presents with   [facility] is a 41 y.o. male.  Kristopher Dunn is a 41 y.o. male patient with a history of hypertension, asthma and diabetes, who presents to the emergency department for evaluation of right ear pain.  Pain has been present for the past 3 days.  Patient describes it as a constant burning ache in his ear with some pain radiating behind the ear.  He reports it feels like the ear is popping intermittently in his ear feels full and hearing is muffled.  He has not noted any specific drainage from the ear.  Has had similar symptoms previously in the left ear with a history of frequent ear infections on the side.  No associated fevers, rhinorrhea or nasal congestion.  No cough.  No other aggravating or alleviating factors.  The history is provided by the patient and a significant other.  Otalgia Associated symptoms: no congestion, no ear discharge, no fever, no headaches and no rhinorrhea       Home Medications Prior to Admission medications   Medication Sig Start Date End Date Taking? Authorizing Provider  Accu-Chek Softclix Lancets lancets Use as instructed 05/13/22   Mayers, Cari S, PA-C  albuterol (VENTOLIN HFA) 108 (90 Base) MCG/ACT inhaler Inhale 1-2 puffs into the lungs every 6 (six) hours as needed for wheezing or shortness of breath. 03/12/22   Charlott Rakes, MD  amLODipine (NORVASC) 5 MG tablet Take 1 tablet (5 mg total) by mouth daily. 03/12/22   Charlott Rakes, MD  aspirin EC 81 MG tablet Take 81 mg by mouth daily. Swallow whole.    [provider]  atenolol (TENORMIN) 50 MG tablet Take 1 tablet (50 mg total) by mouth daily. 03/12/22   Charlott Rakes, MD  Blood Glucose Monitoring Suppl (ACCU-CHEK GUIDE) w/Device KIT Use as directed tid 05/14/22   Mayers, Cari S, PA-C  gabapentin  (NEURONTIN) 100 MG capsule Take 100 mg by mouth at bedtime. 02/21/21   [provider]  glucose blood (ACCU-CHEK GUIDE) test strip Use as instructed tid 05/14/22   Mayers, Cari S, PA-C  metFORMIN (GLUCOPHAGE-XR) 500 MG 24 hr tablet Take 1 tablet (500 mg total) by mouth daily with breakfast. 05/13/22   Mayers, Cari S, PA-C  naproxen (NAPROSYN) 500 MG tablet Take 1 tablet (500 mg total) by mouth 2 (two) times daily with a meal. 11/29/21   Jaynee Eagles, PA-C  omeprazole (PRILOSEC) 40 MG capsule Take 40 mg by mouth daily. 12/24/20   [provider]  Oxycodone HCl 10 MG TABS 1 tablet as needed    [provider]  Vitamin D, Ergocalciferol, (DRISDOL) 1.25 MG (50000 UNIT) CAPS capsule Take 50,000 Units by mouth every 7 (seven) days. Sundays 02/21/21   [provider]      Allergies    Peanuts [peanut oil]    Review of Systems   Review of Systems  Constitutional:  Negative for chills and fever.  HENT:  Positive for ear pain. Negative for congestion, ear discharge and rhinorrhea.   Neurological:  Negative for headaches.   Physical Exam Updated Vital Signs BP (!) 147/91 (BP Location: Left Arm)   Pulse 76   Temp 98.7 F (37.1 C) (Oral)   Resp 18   SpO2 95%  Physical Exam Vitals and nursing note reviewed.  Constitutional:      General: He is not in acute distress.    Appearance: Normal appearance. He is well-developed. He is not ill-appearing or diaphoretic.  HENT:     Head: Normocephalic and atraumatic.     Right Ear: Tympanic membrane is erythematous, retracted and bulging.     Left Ear: Ear canal normal. Tympanic membrane is erythematous.     Ears:     Comments: Right TM erythematous and bulging with effusion present, left TM is mildly erythematous but without bulging or effusion.  No pre or postauricular lymphadenopathy, no mastoid tenderness.  No vesicular lesions Eyes:     General:        Right eye: No discharge.        Left eye: No discharge.   Cardiovascular:     Rate and Rhythm: Normal rate.  Pulmonary:     Effort: Pulmonary effort is normal. No respiratory distress.  Musculoskeletal:     Cervical back: Neck supple. No rigidity.  Skin:    General: Skin is warm and dry.  Neurological:     Mental Status: He is alert and oriented to person, place, and time.     Coordination: Coordination normal.  Psychiatric:        Mood and Affect: Mood normal.        Behavior: Behavior normal.    ED Results / Procedures / Treatments   Labs (all labs ordered are listed, but only abnormal results are displayed) Labs Reviewed - No data to display  EKG None  Radiology No results found.  Procedures Procedures    Medications Ordered in ED Medications - No data to display  ED Course/ Medical Decision Making/ A&P                           Medical Decision Making  41 y.o. male presents to the ED with complaints of otalgia, this involves an extensive number of treatment options, and is a complaint that carries with it a high risk of complications and morbidity.  The differential diagnosis includes otitis media, otitis externa, mastoiditis, malignant otitis externa, shingles   Patient presents with otalgia and exam consistent with acute otitis media. No concern for acute mastoiditis, meningitis.  Patient discharged home with Augmentin.  Advised patient to call PCP for follow-up.  I have also discussed reasons to return immediately to the ER.  Patient expresses understanding and agrees with plan.          Final Clinical Impression(s) / ED Diagnoses Final diagnoses:  Acute otitis media, unspecified otitis media type    Rx / DC Orders ED Discharge Orders          Ordered    amoxicillin-clavulanate (AUGMENTIN) 875-125 MG tablet  Every 12 hours        05/17/22 0714              Jacqlyn Larsen, PA-C 05/17/22 1610    Fatima Blank, MD 05/18/22 2022

## 2022-05-30 ENCOUNTER — Encounter (HOSPITAL_COMMUNITY): Payer: Self-pay

## 2022-05-30 ENCOUNTER — Ambulatory Visit: Payer: 59 | Admitting: Orthopaedic Surgery

## 2022-05-30 ENCOUNTER — Other Ambulatory Visit: Payer: Self-pay | Admitting: Physician Assistant

## 2022-05-30 ENCOUNTER — Ambulatory Visit (HOSPITAL_COMMUNITY)
Admission: EM | Admit: 2022-05-30 | Discharge: 2022-05-30 | Disposition: A | Discharge status: Home / Self Care | Payer: 59 | Attending: Emergency Medicine | Admitting: Emergency Medicine

## 2022-05-30 DIAGNOSIS — H6692 Otitis media, unspecified, left ear: Secondary | ICD-10-CM

## 2022-05-30 MED ORDER — NEOMYCIN-POLYMYXIN-HC 3.5-10000-1 OT SUSP: 4.0000 [drp] | Freq: Three times a day (TID) | OTIC | 0 refills | Status: DC
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
## 2022-05-30 NOTE — Discharge Instructions (Signed)
Today you are being treated for an infection of the ear canal on the left side   Place 4 drops, three times a day into left ear for 7 days   May finish oral antibiotic course   You may use Tylenol or ibuprofen for management of discomfort  May hold warm compresses to the ear for additional comfort  Please not attempted any ear cleaning or object or fluid placement into the ear canal to prevent further irritation

## 2022-05-30 NOTE — ED Triage Notes (Signed)
Pt reports left ear pain x 1 week. He was seen and treated at ED on 05/17/2022 for a right ear infection.

## 2022-05-30 NOTE — Telephone Encounter (Signed)
Patient has not received care from Georganna Skeans, MD.

## 2022-05-30 NOTE — Telephone Encounter (Signed)
Pt patient called very upset, requesting an update on medication refills.  Pt stated the pharmacy has sent this request multiple times, but we have not filled it.     Please advise. Pt is requesting a call back today.

## 2022-06-12 ENCOUNTER — Ambulatory Visit: Payer: 59 | Admitting: Family Medicine

## 2022-06-17 ENCOUNTER — Telehealth: Payer: Self-pay | Admitting: Family Medicine

## 2022-06-17 DIAGNOSIS — G4733 Obstructive sleep apnea (adult) (pediatric): Secondary | ICD-10-CM | POA: Insufficient documentation

## 2022-07-05 ENCOUNTER — Other Ambulatory Visit: Payer: Self-pay

## 2022-07-05 ENCOUNTER — Encounter (HOSPITAL_COMMUNITY): Payer: Self-pay | Admitting: Emergency Medicine

## 2022-07-05 ENCOUNTER — Emergency Department (HOSPITAL_COMMUNITY)
Admission: EM | Admit: 2022-07-05 | Discharge: 2022-07-05 | Discharge status: Against Medical Advice | Payer: Commercial Managed Care - HMO | Attending: Emergency Medicine | Admitting: Emergency Medicine

## 2022-07-05 ENCOUNTER — Emergency Department (HOSPITAL_COMMUNITY)
Admission: EM | Admit: 2022-07-05 | Discharge: 2022-07-05 | Disposition: A | Discharge status: Home / Self Care | Payer: Commercial Managed Care - HMO | Source: Home / Self Care | Attending: Emergency Medicine | Admitting: Emergency Medicine

## 2022-07-05 ENCOUNTER — Encounter (HOSPITAL_COMMUNITY): Payer: Self-pay

## 2022-07-05 DIAGNOSIS — Z79891 Long term (current) use of opiate analgesic: Secondary | ICD-10-CM | POA: Diagnosis not present

## 2022-07-05 DIAGNOSIS — R5383 Other fatigue: Secondary | ICD-10-CM | POA: Insufficient documentation

## 2022-07-05 DIAGNOSIS — Z7982 Long term (current) use of aspirin: Secondary | ICD-10-CM | POA: Insufficient documentation

## 2022-07-05 DIAGNOSIS — J45909 Unspecified asthma, uncomplicated: Secondary | ICD-10-CM | POA: Insufficient documentation

## 2022-07-05 DIAGNOSIS — Z87891 Personal history of nicotine dependence: Secondary | ICD-10-CM | POA: Insufficient documentation

## 2022-07-05 DIAGNOSIS — T6591XA Toxic effect of unspecified substance, accidental (unintentional), initial encounter: Secondary | ICD-10-CM | POA: Insufficient documentation

## 2022-07-05 DIAGNOSIS — Z91010 Allergy to peanuts: Secondary | ICD-10-CM | POA: Insufficient documentation

## 2022-07-05 DIAGNOSIS — Z79899 Other long term (current) drug therapy: Secondary | ICD-10-CM | POA: Insufficient documentation

## 2022-07-05 DIAGNOSIS — Z5321 Procedure and treatment not carried out due to patient leaving prior to being seen by health care provider: Secondary | ICD-10-CM | POA: Diagnosis not present

## 2022-07-05 DIAGNOSIS — R400 Somnolence: Secondary | ICD-10-CM | POA: Diagnosis not present

## 2022-07-05 DIAGNOSIS — Z8616 Personal history of COVID-19: Secondary | ICD-10-CM | POA: Insufficient documentation

## 2022-07-05 DIAGNOSIS — Z7951 Long term (current) use of inhaled steroids: Secondary | ICD-10-CM | POA: Insufficient documentation

## 2022-07-05 DIAGNOSIS — F10129 Alcohol abuse with intoxication, unspecified: Secondary | ICD-10-CM | POA: Insufficient documentation

## 2022-07-05 DIAGNOSIS — I10 Essential (primary) hypertension: Secondary | ICD-10-CM | POA: Insufficient documentation

## 2022-07-05 LAB — CBC WITH DIFFERENTIAL/PLATELET
Abs Immature Granulocytes: 0.01 10*3/uL (ref 0.00–0.07)
Basophils Absolute: 0 10*3/uL (ref 0.0–0.1)
Basophils Relative: 1 %
Eosinophils Absolute: 0.1 10*3/uL (ref 0.0–0.5)
Eosinophils Relative: 2 %
HCT: 40.7 % (ref 39.0–52.0)
Hemoglobin: 13.4 g/dL (ref 13.0–17.0)
Immature Granulocytes: 0 %
Lymphocytes Relative: 36 %
Lymphs Abs: 1.2 10*3/uL (ref 0.7–4.0)
MCH: 32.3 pg (ref 26.0–34.0)
MCHC: 32.9 g/dL (ref 30.0–36.0)
MCV: 98.1 fL (ref 80.0–100.0)
Monocytes Absolute: 0.4 10*3/uL (ref 0.1–1.0)
Monocytes Relative: 11 %
Neutro Abs: 1.6 10*3/uL — ABNORMAL LOW (ref 1.7–7.7)
Neutrophils Relative %: 50 %
Platelets: 195 10*3/uL (ref 150–400)
RBC: 4.15 MIL/uL — ABNORMAL LOW (ref 4.22–5.81)
RDW: 12.8 % (ref 11.5–15.5)
WBC: 3.3 10*3/uL — ABNORMAL LOW (ref 4.0–10.5)
nRBC: 0 % (ref 0.0–0.2)

## 2022-07-05 LAB — BASIC METABOLIC PANEL
Anion gap: 10 (ref 5–15)
BUN: 15 mg/dL (ref 6–20)
CO2: 25 mmol/L (ref 22–32)
Calcium: 9.6 mg/dL (ref 8.9–10.3)
Chloride: 104 mmol/L (ref 98–111)
Creatinine, Ser: 0.93 mg/dL (ref 0.61–1.24)
GFR, Estimated: >60 mL/min (ref >60)
Glucose, Bld: 103 mg/dL — ABNORMAL HIGH (ref 70–99)
Potassium: 4 mmol/L (ref 3.5–5.1)
Sodium: 139 mmol/L (ref 135–145)

## 2022-07-05 LAB — RAPID URINE DRUG SCREEN, HOSP PERFORMED
Amphetamines: NOT DETECTED
Barbiturates: NOT DETECTED
Benzodiazepines: NOT DETECTED
Cocaine: POSITIVE — AB
Opiates: NOT DETECTED
Tetrahydrocannabinol: NOT DETECTED

## 2022-07-05 LAB — ETHANOL: Alcohol, Ethyl (B): 15 mg/dL — ABNORMAL HIGH (ref <10)

## 2022-07-05 MED ORDER — SODIUM CHLORIDE 0.9 % IV BOLUS
1000.0000 mL | Freq: Once | INTRAVENOUS | Status: AC
  Administered 2022-07-05: 1000 mL via INTRAVENOUS

## 2022-07-05 MED ORDER — SODIUM CHLORIDE 0.9 % IV BOLUS: 1000.0000 mL | Freq: Once | INTRAVENOUS | Status: DC

## 2022-07-05 MED ORDER — NALOXONE HCL 2 MG/2ML IJ SOSY
2.0000 mg | PREFILLED_SYRINGE | Freq: Once | INTRAMUSCULAR | Status: AC
  Administered 2022-07-05: 2 mg via INTRAVENOUS
  Filled 2022-07-05: qty 2

## 2022-07-05 MED ORDER — NALOXONE HCL 0.4 MG/ML IJ SOLN
0.4000 mg | Freq: Once | INTRAMUSCULAR | Status: AC
Start: 2022-07-05 — End: 2022-07-05
  Administered 2022-07-05: 0.4 mg via INTRAVENOUS
  Filled 2022-07-05: qty 1
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
## 2022-07-05 NOTE — ED Notes (Signed)
2nd dose of 0.4 narcan given
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
## 2022-07-05 NOTE — Discharge Instructions (Signed)
Followup with your doctor as needed

## 2022-07-05 NOTE — ED Notes (Signed)
EMT went  to check on pt, pt not there. All bathrooms near pt's room checked and pt not found. PA notified

## 2022-07-05 NOTE — ED Notes (Signed)
1.2 ml of narcan not given per provider orders.
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
## 2022-07-05 NOTE — ED Provider Notes (Signed)
Patient signed to me by prior provider pending results of work-up here.  Instruction is positive for cocaine.  He is alert and oriented x3 at this time.  Will discharge   Lorre Nick, MD 07/05/22 1023

## 2022-07-05 NOTE — ED Notes (Signed)
0.4 narcan first dose given

## 2022-07-31 ENCOUNTER — Other Ambulatory Visit: Payer: Self-pay | Admitting: Family Medicine

## 2022-07-31 DIAGNOSIS — I10 Essential (primary) hypertension: Secondary | ICD-10-CM

## 2022-07-31 NOTE — Telephone Encounter (Signed)
Pt called to report that he is completely out of his current supply, also states that he has been waiting two months for a blood glucose monitor that is compatible. Says he has never received a call back.    Pt is requesting to speak to the clinic today   Best contact: [phone]

## 2022-08-18 ENCOUNTER — Emergency Department (HOSPITAL_COMMUNITY): Payer: Commercial Managed Care - HMO

## 2022-08-18 ENCOUNTER — Emergency Department (HOSPITAL_COMMUNITY)
Admission: EM | Admit: 2022-08-18 | Discharge: 2022-08-19 | Discharge status: Against Medical Advice | Payer: Commercial Managed Care - HMO | Attending: Emergency Medicine | Admitting: Emergency Medicine

## 2022-08-18 ENCOUNTER — Encounter (HOSPITAL_COMMUNITY): Payer: Self-pay

## 2022-08-18 ENCOUNTER — Other Ambulatory Visit: Payer: Self-pay

## 2022-08-18 DIAGNOSIS — R0789 Other chest pain: Secondary | ICD-10-CM | POA: Diagnosis present

## 2022-08-18 DIAGNOSIS — R0602 Shortness of breath: Secondary | ICD-10-CM | POA: Insufficient documentation

## 2022-08-18 DIAGNOSIS — Z5321 Procedure and treatment not carried out due to patient leaving prior to being seen by health care provider: Secondary | ICD-10-CM | POA: Insufficient documentation

## 2022-08-18 LAB — BASIC METABOLIC PANEL
Anion gap: 7 (ref 5–15)
BUN: 13 mg/dL (ref 6–20)
CO2: 25 mmol/L (ref 22–32)
Calcium: 9.3 mg/dL (ref 8.9–10.3)
Chloride: 108 mmol/L (ref 98–111)
Creatinine, Ser: 0.89 mg/dL (ref 0.61–1.24)
GFR, Estimated: >60 mL/min (ref >60)
Glucose, Bld: 139 mg/dL — ABNORMAL HIGH (ref 70–99)
Potassium: 4 mmol/L (ref 3.5–5.1)
Sodium: 140 mmol/L (ref 135–145)

## 2022-08-18 LAB — CBC
HCT: 44 % (ref 39.0–52.0)
Hemoglobin: 14.4 g/dL (ref 13.0–17.0)
MCH: 32.3 pg (ref 26.0–34.0)
MCHC: 32.7 g/dL (ref 30.0–36.0)
MCV: 98.7 fL (ref 80.0–100.0)
Platelets: 208 10*3/uL (ref 150–400)
RBC: 4.46 MIL/uL (ref 4.22–5.81)
RDW: 12.7 % (ref 11.5–15.5)
WBC: 4.4 10*3/uL (ref 4.0–10.5)
nRBC: 0 % (ref 0.0–0.2)

## 2022-08-18 LAB — TROPONIN I (HIGH SENSITIVITY): Troponin I (High Sensitivity): 2 ng/L (ref <18)

## 2022-08-18 NOTE — ED Triage Notes (Signed)
Patient c/o mid chest tightness x 3 days. Patient states that he had SOB yesterday, but none today.

## 2022-08-20 ENCOUNTER — Telehealth: Payer: Self-pay | Admitting: Cardiology

## 2022-08-20 ENCOUNTER — Emergency Department (HOSPITAL_COMMUNITY): Payer: Commercial Managed Care - HMO

## 2022-08-20 ENCOUNTER — Other Ambulatory Visit: Payer: Self-pay

## 2022-08-20 ENCOUNTER — Emergency Department (HOSPITAL_COMMUNITY)
Admission: EM | Admit: 2022-08-20 | Discharge: 2022-08-20 | Disposition: A | Discharge status: Home / Self Care | Payer: Commercial Managed Care - HMO | Attending: Emergency Medicine | Admitting: Emergency Medicine

## 2022-08-20 ENCOUNTER — Encounter (HOSPITAL_COMMUNITY): Payer: Self-pay

## 2022-08-20 DIAGNOSIS — Z91010 Allergy to peanuts: Secondary | ICD-10-CM | POA: Insufficient documentation

## 2022-08-20 DIAGNOSIS — R079 Chest pain, unspecified: Secondary | ICD-10-CM

## 2022-08-20 DIAGNOSIS — Z7982 Long term (current) use of aspirin: Secondary | ICD-10-CM | POA: Insufficient documentation

## 2022-08-20 DIAGNOSIS — R0789 Other chest pain: Secondary | ICD-10-CM | POA: Diagnosis not present

## 2022-08-20 LAB — CBC
HCT: 39.7 % (ref 39.0–52.0)
Hemoglobin: 13.5 g/dL (ref 13.0–17.0)
MCH: 32.5 pg (ref 26.0–34.0)
MCHC: 34 g/dL (ref 30.0–36.0)
MCV: 95.4 fL (ref 80.0–100.0)
Platelets: 189 10*3/uL (ref 150–400)
RBC: 4.16 MIL/uL — ABNORMAL LOW (ref 4.22–5.81)
RDW: 12.5 % (ref 11.5–15.5)
WBC: 3.5 10*3/uL — ABNORMAL LOW (ref 4.0–10.5)
nRBC: 0 % (ref 0.0–0.2)

## 2022-08-20 LAB — BASIC METABOLIC PANEL
Anion gap: 8 (ref 5–15)
BUN: 12 mg/dL (ref 6–20)
CO2: 25 mmol/L (ref 22–32)
Calcium: 9.2 mg/dL (ref 8.9–10.3)
Chloride: 106 mmol/L (ref 98–111)
Creatinine, Ser: 0.85 mg/dL (ref 0.61–1.24)
GFR, Estimated: >60 mL/min (ref >60)
Glucose, Bld: 120 mg/dL — ABNORMAL HIGH (ref 70–99)
Potassium: 3.8 mmol/L (ref 3.5–5.1)
Sodium: 139 mmol/L (ref 135–145)

## 2022-08-20 LAB — TROPONIN I (HIGH SENSITIVITY): Troponin I (High Sensitivity): 3 ng/L (ref <18)

## 2022-08-20 MED ORDER — ASPIRIN 81 MG PO CHEW
243.0000 mg | CHEWABLE_TABLET | Freq: Once | ORAL | Status: AC
  Administered 2022-08-20: 243 mg via ORAL
  Filled 2022-08-20: qty 3

## 2022-08-20 MED ORDER — ASPIRIN 81 MG PO TBEC
324.0000 mg | DELAYED_RELEASE_TABLET | Freq: Once | ORAL | Status: DC
  Filled 2022-08-20: qty 4

## 2022-08-20 NOTE — ED Triage Notes (Signed)
Pt arrived POV from home c/o left sided chest pain x4 days that radiates into his left arm and up into his neck and into his face. Pt also endorses SHOB and lightheadedness.

## 2022-08-20 NOTE — Discharge Instructions (Signed)
Follow-up with cardiology.  They are likely going to do a heart catheterization.

## 2022-08-20 NOTE — ED Provider Triage Note (Signed)
Emergency Medicine Provider Triage Evaluation Note  Kristopher Dunn , a 41 y.o. male  was evaluated in triage.  Pt complains of worsening chest pain over the last 4 days with mild intermittent shortness of breath.  Pain radiates into the left shoulder and left neck.  Came and went the first 2 days, however has been remaining somewhat constant since this morning.  Denies N/V/D, fevers, upper respiratory symptoms, cough, lower extremity weakness, or tearing pain.  No significant cardiac history.  Hx of asthma, though states this feels very different.  Denies IVDU, cocaine, or methamphetamine use in the last 1 to 2 weeks.  Review of Systems  Positive:  Negative: See above  Physical Exam  BP 132/87   Pulse 66   Temp 98.3 F (36.8 C) (Oral)   Resp 15   Ht 5\' 11"  (1.803 m)   Wt (!) 154.2 kg   SpO2 96%   BMI 47.42 kg/m  Gen:   Awake, no distress   Resp:  Normal effort  MSK:   Moves extremities without difficulty  Other:  Abdomen non-TTP.  RRR without M/R/G.  CTAB.  Medical Decision Making  Medically screening exam initiated at 12:40 PM.  Appropriate orders placed.  Kristopher Dunn was informed that the remainder of the evaluation will be completed by another provider, this initial triage assessment does not replace that evaluation, and the importance of remaining in the ED until their evaluation is complete.     [name], PA-C 08/20/22 1246

## 2022-08-20 NOTE — Telephone Encounter (Signed)
Patient presented today on 08/20/2022 with chest pain, serum troponins are negative, EKG negative for ischemia.  Patient also had presented with chest pain 2 days ago.  Again troponins were negative but he did not wait for complete work-up and went home.  After evaluation by the ED physician, chest pain felt to be atypical at most and probably noncardiac.  Patient is also on chronic pain medications.  I reviewed his coronary CT angiogram, he has mild noncritical coronary disease.  Continued primary prevention is indicated.  Best option in view of recurrent ED evaluation for chest pain would be to proceed with cardiac catheterization if felt appropriate.  He has an appointment to see Dr. Odis Hollingshead on 09/02/2022 which he will keep.

## 2022-08-21 ENCOUNTER — Other Ambulatory Visit: Payer: Self-pay

## 2022-08-21 ENCOUNTER — Encounter (HOSPITAL_COMMUNITY): Payer: Self-pay

## 2022-08-21 ENCOUNTER — Emergency Department (HOSPITAL_COMMUNITY)
Admission: EM | Admit: 2022-08-21 | Discharge: 2022-08-22 | Disposition: A | Discharge status: Home / Self Care | Payer: Commercial Managed Care - HMO | Attending: Emergency Medicine | Admitting: Emergency Medicine

## 2022-08-21 DIAGNOSIS — Z79899 Other long term (current) drug therapy: Secondary | ICD-10-CM | POA: Diagnosis not present

## 2022-08-21 DIAGNOSIS — R0602 Shortness of breath: Secondary | ICD-10-CM | POA: Insufficient documentation

## 2022-08-21 DIAGNOSIS — Z7982 Long term (current) use of aspirin: Secondary | ICD-10-CM | POA: Insufficient documentation

## 2022-08-21 DIAGNOSIS — Z7984 Long term (current) use of oral hypoglycemic drugs: Secondary | ICD-10-CM | POA: Diagnosis not present

## 2022-08-21 DIAGNOSIS — R079 Chest pain, unspecified: Secondary | ICD-10-CM | POA: Diagnosis not present

## 2022-08-21 DIAGNOSIS — Z91010 Allergy to peanuts: Secondary | ICD-10-CM | POA: Insufficient documentation

## 2022-08-21 DIAGNOSIS — I10 Essential (primary) hypertension: Secondary | ICD-10-CM | POA: Diagnosis not present

## 2022-08-21 LAB — CBC
HCT: 42 % (ref 39.0–52.0)
Hemoglobin: 13.7 g/dL (ref 13.0–17.0)
MCH: 31.7 pg (ref 26.0–34.0)
MCHC: 32.6 g/dL (ref 30.0–36.0)
MCV: 97.2 fL (ref 80.0–100.0)
Platelets: 212 10*3/uL (ref 150–400)
RBC: 4.32 MIL/uL (ref 4.22–5.81)
RDW: 12.5 % (ref 11.5–15.5)
WBC: 3.9 10*3/uL — ABNORMAL LOW (ref 4.0–10.5)
nRBC: 0 % (ref 0.0–0.2)

## 2022-08-21 LAB — BASIC METABOLIC PANEL
Anion gap: 9 (ref 5–15)
BUN: 13 mg/dL (ref 6–20)
CO2: 26 mmol/L (ref 22–32)
Calcium: 9.5 mg/dL (ref 8.9–10.3)
Chloride: 104 mmol/L (ref 98–111)
Creatinine, Ser: 1.11 mg/dL (ref 0.61–1.24)
GFR, Estimated: >60 mL/min (ref >60)
Glucose, Bld: 91 mg/dL (ref 70–99)
Potassium: 4.7 mmol/L (ref 3.5–5.1)
Sodium: 139 mmol/L (ref 135–145)

## 2022-08-21 LAB — TROPONIN I (HIGH SENSITIVITY): Troponin I (High Sensitivity): 4 ng/L (ref <18)
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
## 2022-08-21 NOTE — ED Triage Notes (Signed)
Reports was seen yesterday for same symptoms which was cp radiating into his jaw and shortness of breath.  Reports he was discharged and told to follow up with cardiology.  Reports he is back but "something is wrong".
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
## 2022-08-21 NOTE — ED Provider Triage Note (Signed)
Emergency Medicine Provider Triage Evaluation Note  [facility] , a 41 y.o. male  was evaluated in triage.  Pt complains of chest pain, fatigue, and SOB x 5 days. Was seen here yesterday for similar sx, had negative troponins. Case was reviewed with Dr Jacinto Halim who reviewed patient's coronary CT angiogram with mild noncritical coronary disease and recommended primary prevention and cardiology follow up, which pt has appointment on 9/12. Pt states his symptoms got worse around noon today and he feels "something's just not right". Was previously told he's prediabetic but hasn't seen anyone about this in months.   Review of Systems  Positive: CP, SOB, fatigue Negative: cough  Physical Exam  BP (!) 148/81 (BP Location: Right Arm)   Pulse 65   Temp 98.4 F (36.9 C) (Oral)   Resp 16   Ht 5\' 11"  (1.803 m)   Wt (!) 154.2 kg   SpO2 96%   BMI 47.42 kg/m  Gen:   Awake, no distress   Resp:  Normal effort  MSK:   Moves extremities without difficulty  Other:    Medical Decision Making  Medically screening exam initiated at 7:02 PM.  Appropriate orders placed.  Jacorey IWAO SHAMBLIN was informed that the remainder of the evaluation will be completed by another provider, this initial triage assessment does not replace that evaluation, and the importance of remaining in the ED until their evaluation is complete.     [name] 08/21/22 1905
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
## 2022-08-21 NOTE — Discharge Instructions (Signed)
Note that your work-up today was overall reassuring.  It does not look like you are actively having heart attack.  I have called your cardiologist who agreed to have an appointment tomorrow for you so please call them at your earliest convenience to discuss about exact time of appointment.  As we discussed, please do not hesitate to return to the emergency department for worrisome signs and symptoms we discussed become apparent.

## 2022-08-22 ENCOUNTER — Encounter: Payer: Self-pay | Admitting: Cardiology

## 2022-08-22 ENCOUNTER — Ambulatory Visit: Payer: Commercial Managed Care - HMO | Admitting: Cardiology

## 2022-08-22 VITALS — BP 137/87 | HR 80 | Temp 98.6°F | Resp 12 | Ht 71.0 in | Wt 345.0 lb

## 2022-08-22 DIAGNOSIS — M940 Chondrocostal junction syndrome [Tietze]: Secondary | ICD-10-CM

## 2022-08-22 DIAGNOSIS — R931 Abnormal findings on diagnostic imaging of heart and coronary circulation: Secondary | ICD-10-CM

## 2022-08-22 LAB — TROPONIN I (HIGH SENSITIVITY): Troponin I (High Sensitivity): 4 ng/L (ref <18)

## 2022-08-22 NOTE — Patient Instructions (Signed)
Reasons for reproducible pain explained to the patient. Heat to tender area. Cold ice compressions to reduce inflammation. Recommended Aleve OTC 1-2 tablets BID for 3-4 days and PRN. Reassured the patient.
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
## 2022-09-02 ENCOUNTER — Ambulatory Visit: Payer: Commercial Managed Care - HMO | Admitting: Cardiology

## 2022-09-06 ENCOUNTER — Emergency Department (HOSPITAL_COMMUNITY): Payer: Commercial Managed Care - HMO

## 2022-09-06 ENCOUNTER — Other Ambulatory Visit: Payer: Self-pay

## 2022-09-06 ENCOUNTER — Emergency Department (HOSPITAL_COMMUNITY)
Admission: EM | Admit: 2022-09-06 | Discharge: 2022-09-06 | Discharge status: Against Medical Advice | Payer: Commercial Managed Care - HMO | Attending: Emergency Medicine | Admitting: Emergency Medicine

## 2022-09-06 ENCOUNTER — Encounter (HOSPITAL_COMMUNITY): Payer: Self-pay | Admitting: Emergency Medicine

## 2022-09-06 DIAGNOSIS — R079 Chest pain, unspecified: Secondary | ICD-10-CM | POA: Insufficient documentation

## 2022-09-06 DIAGNOSIS — R0602 Shortness of breath: Secondary | ICD-10-CM | POA: Diagnosis not present

## 2022-09-06 DIAGNOSIS — Z5321 Procedure and treatment not carried out due to patient leaving prior to being seen by health care provider: Secondary | ICD-10-CM | POA: Diagnosis not present

## 2022-09-06 LAB — RAPID URINE DRUG SCREEN, HOSP PERFORMED
Amphetamines: NOT DETECTED
Barbiturates: NOT DETECTED
Benzodiazepines: NOT DETECTED
Cocaine: POSITIVE — AB
Opiates: NOT DETECTED
Tetrahydrocannabinol: NOT DETECTED

## 2022-09-06 LAB — COMPREHENSIVE METABOLIC PANEL
ALT: 52 U/L — ABNORMAL HIGH (ref 0–44)
AST: 28 U/L (ref 15–41)
Albumin: 4 g/dL (ref 3.5–5.0)
Alkaline Phosphatase: 60 U/L (ref 38–126)
Anion gap: 15 (ref 5–15)
BUN: 15 mg/dL (ref 6–20)
CO2: 22 mmol/L (ref 22–32)
Calcium: 9 mg/dL (ref 8.9–10.3)
Chloride: 100 mmol/L (ref 98–111)
Creatinine, Ser: 1.05 mg/dL (ref 0.61–1.24)
GFR, Estimated: >60 mL/min (ref >60)
Glucose, Bld: 142 mg/dL — ABNORMAL HIGH (ref 70–99)
Potassium: 3.5 mmol/L (ref 3.5–5.1)
Sodium: 137 mmol/L (ref 135–145)
Total Bilirubin: 0.5 mg/dL (ref 0.3–1.2)
Total Protein: 7.6 g/dL (ref 6.5–8.1)

## 2022-09-06 LAB — CBC
HCT: 41 % (ref 39.0–52.0)
Hemoglobin: 14 g/dL (ref 13.0–17.0)
MCH: 32.5 pg (ref 26.0–34.0)
MCHC: 34.1 g/dL (ref 30.0–36.0)
MCV: 95.1 fL (ref 80.0–100.0)
Platelets: 219 10*3/uL (ref 150–400)
RBC: 4.31 MIL/uL (ref 4.22–5.81)
RDW: 12.2 % (ref 11.5–15.5)
WBC: 4.8 10*3/uL (ref 4.0–10.5)
nRBC: 0 % (ref 0.0–0.2)

## 2022-09-06 LAB — TROPONIN I (HIGH SENSITIVITY)
Troponin I (High Sensitivity): 3 ng/L (ref <18)
Troponin I (High Sensitivity): 3 ng/L (ref <18)

## 2022-09-06 NOTE — ED Notes (Signed)
Pt witnessed leaving ED by ED staff.

## 2022-09-06 NOTE — ED Triage Notes (Signed)
Pt dropped off POV, pt lethargic during triage, admits to drug use but cannot recall what he used, pt reports chest pain x several weeks

## 2022-09-06 NOTE — ED Notes (Signed)
Called pt x3 for vitals, no response. 

## 2022-09-06 NOTE — ED Provider Triage Note (Signed)
Emergency Medicine Provider Triage Evaluation Note  GREOGRY GOODWYN , a 41 y.o. male  was evaluated in triage.  Pt complains of left-sided chest pain and shortness of breath ongoing for several days.  States he was using drugs immediately prior to arrival but cannot tell me what he was using.  Has a history of cocaine use as well as other polysubstance use.  Patient initially refusing to answer any questions for this provider in triage and there was concern by intake ED staff that patient was altered in his mental status.  After crossing the patient during interview it became apparent that he is alert and oriented and capable of answering questions with reasonable answers but was initially simply refusing to speak this provider.  Review of Systems  Positive: Chest pain shortness of breath, intoxication Negative: - ROS incomplete due to patient's refusal to answer this provider's questions  Physical Exam  There were no vitals taken for this visit. Gen:   Awake, no distress, appears intoxicated Resp:  Normal effort lungs CTA B bilaterally MSK:   Moves extremities without difficulty  Other:  RRR no MR/G abdomen soft nondistended nontender.  Neurovascular intact in all extremities  Medical Decision Making  Medically screening exam initiated at 6:47 AM.  Appropriate orders placed.  Chanan OSMIN WELZ was informed that the remainder of the evaluation will be completed by another provider, this initial triage assessment does not replace that evaluation, and the importance of remaining in the ED until their evaluation is complete.  This chart was dictated using voice recognition software, Dragon. Despite the best efforts of this provider to proofread and correct errors, errors may still occur which can change documentation meaning.    Emeline Darling, PA-C 09/06/22 [phone]

## 2022-09-18 ENCOUNTER — Encounter (HOSPITAL_BASED_OUTPATIENT_CLINIC_OR_DEPARTMENT_OTHER): Payer: Commercial Managed Care - HMO | Admitting: Internal Medicine

## 2022-09-23 ENCOUNTER — Ambulatory Visit: Payer: 59 | Admitting: Family Medicine

## 2022-09-24 ENCOUNTER — Encounter (INDEPENDENT_AMBULATORY_CARE_PROVIDER_SITE_OTHER): Payer: Self-pay | Admitting: Family Medicine

## 2022-10-14 ENCOUNTER — Ambulatory Visit (HOSPITAL_BASED_OUTPATIENT_CLINIC_OR_DEPARTMENT_OTHER): Payer: Commercial Managed Care - HMO | Attending: Family Medicine | Admitting: Internal Medicine

## 2022-11-04 ENCOUNTER — Ambulatory Visit (HOSPITAL_COMMUNITY)
Admission: EM | Admit: 2022-11-04 | Discharge: 2022-11-04 | Disposition: A | Discharge status: Home / Self Care | Payer: Commercial Managed Care - HMO

## 2022-11-21 ENCOUNTER — Ambulatory Visit: Payer: Commercial Managed Care - HMO | Admitting: Cardiology

## 2022-11-21 ENCOUNTER — Encounter: Payer: Self-pay | Admitting: Cardiology

## 2022-11-21 VITALS — BP 138/84 | HR 75 | Resp 18 | Ht 71.0 in | Wt 347.4 lb

## 2022-11-21 DIAGNOSIS — Z8249 Family history of ischemic heart disease and other diseases of the circulatory system: Secondary | ICD-10-CM

## 2022-11-21 DIAGNOSIS — I10 Essential (primary) hypertension: Secondary | ICD-10-CM

## 2022-11-21 DIAGNOSIS — F1491 Cocaine use, unspecified, in remission: Secondary | ICD-10-CM

## 2022-11-21 DIAGNOSIS — Z87891 Personal history of nicotine dependence: Secondary | ICD-10-CM

## 2022-11-21 DIAGNOSIS — R931 Abnormal findings on diagnostic imaging of heart and coronary circulation: Secondary | ICD-10-CM
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
## 2022-11-22 ENCOUNTER — Emergency Department (HOSPITAL_COMMUNITY): Payer: Commercial Managed Care - HMO

## 2022-11-22 ENCOUNTER — Other Ambulatory Visit: Payer: Self-pay

## 2022-11-22 ENCOUNTER — Encounter (HOSPITAL_COMMUNITY): Payer: Self-pay

## 2022-11-22 ENCOUNTER — Emergency Department (HOSPITAL_COMMUNITY)
Admission: EM | Admit: 2022-11-22 | Discharge: 2022-11-22 | Disposition: A | Discharge status: Home / Self Care | Payer: Commercial Managed Care - HMO | Attending: Emergency Medicine | Admitting: Emergency Medicine

## 2022-11-22 DIAGNOSIS — Z91010 Allergy to peanuts: Secondary | ICD-10-CM | POA: Diagnosis not present

## 2022-11-22 DIAGNOSIS — Z1152 Encounter for screening for COVID-19: Secondary | ICD-10-CM | POA: Diagnosis not present

## 2022-11-22 DIAGNOSIS — J069 Acute upper respiratory infection, unspecified: Secondary | ICD-10-CM | POA: Diagnosis not present

## 2022-11-22 DIAGNOSIS — J019 Acute sinusitis, unspecified: Secondary | ICD-10-CM | POA: Diagnosis not present

## 2022-11-22 DIAGNOSIS — R059 Cough, unspecified: Secondary | ICD-10-CM | POA: Diagnosis present

## 2022-11-22 LAB — BASIC METABOLIC PANEL
Anion gap: 9 (ref 5–15)
BUN: 8 mg/dL (ref 6–20)
CO2: 25 mmol/L (ref 22–32)
Calcium: 9.4 mg/dL (ref 8.9–10.3)
Chloride: 103 mmol/L (ref 98–111)
Creatinine, Ser: 0.95 mg/dL (ref 0.61–1.24)
GFR, Estimated: >60 mL/min (ref >60)
Glucose, Bld: 133 mg/dL — ABNORMAL HIGH (ref 70–99)
Potassium: 4 mmol/L (ref 3.5–5.1)
Sodium: 137 mmol/L (ref 135–145)

## 2022-11-22 LAB — CBC
HCT: 41.8 % (ref 39.0–52.0)
Hemoglobin: 14.2 g/dL (ref 13.0–17.0)
MCH: 32.1 pg (ref 26.0–34.0)
MCHC: 34 g/dL (ref 30.0–36.0)
MCV: 94.6 fL (ref 80.0–100.0)
Platelets: 184 10*3/uL (ref 150–400)
RBC: 4.42 MIL/uL (ref 4.22–5.81)
RDW: 12.9 % (ref 11.5–15.5)
WBC: 3.7 10*3/uL — ABNORMAL LOW (ref 4.0–10.5)
nRBC: 0 % (ref 0.0–0.2)

## 2022-11-22 LAB — RESP PANEL BY RT-PCR (FLU A&B, COVID) ARPGX2
Influenza A by PCR: NEGATIVE
Influenza B by PCR: NEGATIVE
SARS Coronavirus 2 by RT PCR: NEGATIVE

## 2022-11-22 MED ORDER — DOXYCYCLINE HYCLATE 100 MG PO CAPS: 100.0000 mg | ORAL_CAPSULE | Freq: Two times a day (BID) | ORAL | 0 refills | Status: DC
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
## 2022-11-22 NOTE — ED Provider Triage Note (Addendum)
Emergency Medicine Provider Triage Evaluation Note  Kristopher Dunn , a 41 y.o. male  was evaluated in triage.  Pt complains of productive cough and SOB x 3 weeks. Thinks he had a fever this morning, took tylenol. Coughing up brown/black tinged mucus. Hx of asthma, has been trying his inhaler without relief. Some chest soreness but no specific pain. Thinks he has pneumonia.   Review of Systems  Positive: SOB, cough, sinus pressure/congestion, chills Negative: CP  Physical Exam  Ht 5\' 11"  (1.803 m)   Wt (!) 146.5 kg   BMI 45.05 kg/m  Gen:   Awake, no distress   Resp:  Normal effort  MSK:   Moves extremities without difficulty  Other:    Medical Decision Making  Medically screening exam initiated at 10:31 AM.  Appropriate orders placed.  Kristopher Dunn was informed that the remainder of the evaluation will be completed by another provider, this initial triage assessment does not replace that evaluation, and the importance of remaining in the ED until their evaluation is complete.  Workup initiated. Per chart review, normal EF on cardiac echo in 2022. Had follow up with cardiologist yesterday, 2023.     [employer], PA-C 11/22/22 1033

## 2022-11-22 NOTE — ED Triage Notes (Signed)
Pt arrived POV from home c/o coughing coughing coughing and feeling weird but cannot really tell us how he feels. Pt states his sinuses hurt.

## 2022-12-18 ENCOUNTER — Encounter (HOSPITAL_BASED_OUTPATIENT_CLINIC_OR_DEPARTMENT_OTHER): Payer: Self-pay | Admitting: Emergency Medicine

## 2022-12-18 ENCOUNTER — Emergency Department (HOSPITAL_BASED_OUTPATIENT_CLINIC_OR_DEPARTMENT_OTHER)
Admission: EM | Admit: 2022-12-18 | Discharge: 2022-12-19 | Disposition: A | Discharge status: Home / Self Care | Payer: Commercial Managed Care - HMO | Attending: Emergency Medicine | Admitting: Emergency Medicine

## 2022-12-18 ENCOUNTER — Other Ambulatory Visit: Payer: Self-pay

## 2022-12-18 DIAGNOSIS — Z87891 Personal history of nicotine dependence: Secondary | ICD-10-CM | POA: Insufficient documentation

## 2022-12-18 DIAGNOSIS — Z7951 Long term (current) use of inhaled steroids: Secondary | ICD-10-CM | POA: Insufficient documentation

## 2022-12-18 DIAGNOSIS — J45909 Unspecified asthma, uncomplicated: Secondary | ICD-10-CM | POA: Insufficient documentation

## 2022-12-18 DIAGNOSIS — I10 Essential (primary) hypertension: Secondary | ICD-10-CM | POA: Diagnosis not present

## 2022-12-18 DIAGNOSIS — Z79899 Other long term (current) drug therapy: Secondary | ICD-10-CM | POA: Insufficient documentation

## 2022-12-18 DIAGNOSIS — R509 Fever, unspecified: Secondary | ICD-10-CM | POA: Diagnosis present

## 2022-12-18 DIAGNOSIS — Z91010 Allergy to peanuts: Secondary | ICD-10-CM | POA: Diagnosis not present

## 2022-12-18 DIAGNOSIS — Z20822 Contact with and (suspected) exposure to covid-19: Secondary | ICD-10-CM | POA: Insufficient documentation

## 2022-12-18 DIAGNOSIS — J101 Influenza due to other identified influenza virus with other respiratory manifestations: Secondary | ICD-10-CM | POA: Insufficient documentation

## 2022-12-18 LAB — RESP PANEL BY RT-PCR (RSV, FLU A&B, COVID)  RVPGX2
Influenza A by PCR: POSITIVE — AB
Influenza B by PCR: NEGATIVE
Resp Syncytial Virus by PCR: NEGATIVE
SARS Coronavirus 2 by RT PCR: NEGATIVE

## 2022-12-18 NOTE — ED Triage Notes (Signed)
"  Symptoms of covid" Sore throat, fever 101, body aches Started yesterday Tylenol around 6pm

## 2022-12-19 MED ORDER — OSELTAMIVIR PHOSPHATE 75 MG PO CAPS
75.0000 mg | ORAL_CAPSULE | Freq: Once | ORAL | Status: AC
  Administered 2022-12-19: 75 mg via ORAL
  Filled 2022-12-19: qty 1

## 2022-12-19 MED ORDER — OSELTAMIVIR PHOSPHATE 75 MG PO CAPS: 75.0000 mg | ORAL_CAPSULE | Freq: Two times a day (BID) | ORAL | 0 refills | Status: DC

## 2022-12-19 MED ORDER — OXYMETAZOLINE HCL 0.05 % NA SOLN
2.0000 | Freq: Two times a day (BID) | NASAL | Status: DC | PRN
  Administered 2022-12-19: 2 via NASAL
  Filled 2022-12-19: qty 30

## 2022-12-19 NOTE — ED Notes (Signed)
Pt agreeable with d/c plan as discussed by provider- this nurse has verbally reinforced d/c instructions and provided pt with written copy.  Pt acknowledges verbal understanding and denies any addl questions, concerns, needs - pt ambulatory at d/c with steady gait accompanied by family member - no acute changes/distress noted.
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
## 2022-12-30 ENCOUNTER — Encounter (HOSPITAL_BASED_OUTPATIENT_CLINIC_OR_DEPARTMENT_OTHER): Payer: Self-pay | Admitting: Emergency Medicine

## 2022-12-30 ENCOUNTER — Emergency Department (HOSPITAL_BASED_OUTPATIENT_CLINIC_OR_DEPARTMENT_OTHER)
Admission: EM | Admit: 2022-12-30 | Discharge: 2022-12-30 | Disposition: A | Discharge status: Home / Self Care | Payer: BLUE CROSS/BLUE SHIELD | Attending: Emergency Medicine | Admitting: Emergency Medicine

## 2022-12-30 ENCOUNTER — Other Ambulatory Visit: Payer: Self-pay

## 2022-12-30 ENCOUNTER — Emergency Department (HOSPITAL_BASED_OUTPATIENT_CLINIC_OR_DEPARTMENT_OTHER): Payer: BLUE CROSS/BLUE SHIELD | Admitting: Radiology

## 2022-12-30 DIAGNOSIS — R52 Pain, unspecified: Secondary | ICD-10-CM | POA: Diagnosis not present

## 2022-12-30 DIAGNOSIS — Z20822 Contact with and (suspected) exposure to covid-19: Secondary | ICD-10-CM | POA: Diagnosis not present

## 2022-12-30 DIAGNOSIS — R509 Fever, unspecified: Secondary | ICD-10-CM | POA: Insufficient documentation

## 2022-12-30 DIAGNOSIS — R197 Diarrhea, unspecified: Secondary | ICD-10-CM | POA: Insufficient documentation

## 2022-12-30 DIAGNOSIS — R059 Cough, unspecified: Secondary | ICD-10-CM | POA: Diagnosis not present

## 2022-12-30 DIAGNOSIS — I10 Essential (primary) hypertension: Secondary | ICD-10-CM | POA: Insufficient documentation

## 2022-12-30 DIAGNOSIS — Z79899 Other long term (current) drug therapy: Secondary | ICD-10-CM | POA: Insufficient documentation

## 2022-12-30 DIAGNOSIS — Z91010 Allergy to peanuts: Secondary | ICD-10-CM | POA: Diagnosis not present

## 2022-12-30 LAB — CBC
HCT: 42 % (ref 39.0–52.0)
Hemoglobin: 14 g/dL (ref 13.0–17.0)
MCH: 31.4 pg (ref 26.0–34.0)
MCHC: 33.3 g/dL (ref 30.0–36.0)
MCV: 94.2 fL (ref 80.0–100.0)
Platelets: 226 10*3/uL (ref 150–400)
RBC: 4.46 MIL/uL (ref 4.22–5.81)
RDW: 12.7 % (ref 11.5–15.5)
WBC: 4.2 10*3/uL (ref 4.0–10.5)
nRBC: 0 % (ref 0.0–0.2)

## 2022-12-30 LAB — TROPONIN I (HIGH SENSITIVITY)
Troponin I (High Sensitivity): 2 ng/L (ref <18)
Troponin I (High Sensitivity): <2 ng/L (ref <18)

## 2022-12-30 LAB — BASIC METABOLIC PANEL
Anion gap: 10 (ref 5–15)
BUN: 8 mg/dL (ref 6–20)
CO2: 26 mmol/L (ref 22–32)
Calcium: 9.6 mg/dL (ref 8.9–10.3)
Chloride: 103 mmol/L (ref 98–111)
Creatinine, Ser: 0.85 mg/dL (ref 0.61–1.24)
GFR, Estimated: >60 mL/min (ref >60)
Glucose, Bld: 131 mg/dL — ABNORMAL HIGH (ref 70–99)
Potassium: 3.8 mmol/L (ref 3.5–5.1)
Sodium: 139 mmol/L (ref 135–145)

## 2022-12-30 LAB — RESP PANEL BY RT-PCR (RSV, FLU A&B, COVID)  RVPGX2
Influenza A by PCR: NEGATIVE
Influenza B by PCR: NEGATIVE
Resp Syncytial Virus by PCR: NEGATIVE
SARS Coronavirus 2 by RT PCR: NEGATIVE

## 2022-12-30 MED ORDER — SODIUM CHLORIDE 0.9 % IV BOLUS
1000.0000 mL | Freq: Once | INTRAVENOUS | Status: AC
  Administered 2022-12-30: 1000 mL via INTRAVENOUS

## 2022-12-30 NOTE — ED Triage Notes (Signed)
Pt arrives pov, steady gait, c/o fever, cough, and congestion x 2 weeks.tylenol pta.  Endorses diarrhea starting last night.  Dx with flu x 2 wks pta
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
## 2022-12-30 NOTE — ED Notes (Signed)
RN provided AVS using Teachback Method. Patient verbalizes understanding of Discharge Instructions. Opportunity for Questioning and Answers were provided by RN. Patient Discharged from ED ambulatory to Home via Self.  

## 2022-12-30 NOTE — Discharge Instructions (Signed)
You were evaluated today for diarrhea and body aches. Your workup revealed no new influenza, Covid, or RSV infection. Your electrolytes appear normal. Please continue to drink fluids and eat as your are able. If you develop bloody diarrhea or other life threatening conditions please return to the emergency department.

## 2023-01-07 ENCOUNTER — Other Ambulatory Visit: Payer: Self-pay

## 2023-01-07 ENCOUNTER — Encounter (HOSPITAL_BASED_OUTPATIENT_CLINIC_OR_DEPARTMENT_OTHER): Payer: Self-pay | Admitting: Emergency Medicine

## 2023-01-07 ENCOUNTER — Emergency Department (HOSPITAL_BASED_OUTPATIENT_CLINIC_OR_DEPARTMENT_OTHER)
Admission: EM | Admit: 2023-01-07 | Discharge: 2023-01-07 | Disposition: A | Discharge status: Home / Self Care | Payer: BLUE CROSS/BLUE SHIELD | Attending: Emergency Medicine | Admitting: Emergency Medicine

## 2023-01-07 DIAGNOSIS — R7309 Other abnormal glucose: Secondary | ICD-10-CM | POA: Diagnosis not present

## 2023-01-07 DIAGNOSIS — M546 Pain in thoracic spine: Secondary | ICD-10-CM | POA: Diagnosis not present

## 2023-01-07 DIAGNOSIS — R109 Unspecified abdominal pain: Secondary | ICD-10-CM | POA: Diagnosis not present

## 2023-01-07 LAB — URINALYSIS, ROUTINE W REFLEX MICROSCOPIC
Bacteria, UA: NONE SEEN
Bilirubin Urine: NEGATIVE
Glucose, UA: NEGATIVE mg/dL
Hgb urine dipstick: NEGATIVE
Ketones, ur: NEGATIVE mg/dL
Nitrite: NEGATIVE
Protein, ur: NEGATIVE mg/dL
Specific Gravity, Urine: 1.025 (ref 1.005–1.030)
pH: 5.5 (ref 5.0–8.0)

## 2023-01-07 LAB — CBG MONITORING, ED: Glucose-Capillary: 129 mg/dL — ABNORMAL HIGH (ref 70–99)

## 2023-01-07 NOTE — ED Notes (Signed)
Discharge paperwork given and verbally understood. 

## 2023-01-07 NOTE — ED Triage Notes (Signed)
Pt c/o left flank pain at base of ribs x 2 days with urinary frequency, denies n/v/fevers/cough

## 2023-01-07 NOTE — ED Provider Notes (Signed)
[facility] EMERGENCY DEPT Provider Note   CSN: [id number] Arrival date & time: 01/07/23  1009     History  Chief Complaint  Patient presents with   Flank Pain    Kristopher Dunn is a 42 y.o. male.  Is a 42 year old male with a past medical history of hypertension presenting to the emergency department with left flank pain.  Patient states for the last 2 days he has had a constant left-sided flank pain.  He denies any recent heavy lifting, changes in activity, trauma or falls.  He denies any nausea, vomiting, fevers or chills.  He states he has had a few loose stools.  He reports increased urinary frequency but denies any dysuria or hematuria.   Flank Pain       Home Medications Prior to Admission medications   Medication Sig Start Date End Date Taking? Authorizing Provider  albuterol (VENTOLIN HFA) 108 (90 Base) MCG/ACT inhaler Inhale 1-2 puffs into the lungs every 6 (six) hours as needed for wheezing or shortness of breath. 03/12/22   Hoy Register, MD  amLODipine (NORVASC) 5 MG tablet Take 1 tablet (5 mg total) by mouth daily. 03/12/22   Hoy Register, MD  atenolol (TENORMIN) 50 MG tablet Take 1 tablet by mouth once daily 07/31/22   Hoy Register, MD  gabapentin (NEURONTIN) 100 MG capsule Take 100 mg by mouth at bedtime. 02/21/21   [provider]  gabapentin (NEURONTIN) 300 MG capsule Take 300 mg by mouth at bedtime. 12/04/22   [provider]  naproxen (NAPROSYN) 500 MG tablet Take 1 tablet (500 mg total) by mouth 2 (two) times daily with a meal. Patient taking differently: Take 500 mg by mouth daily as needed (pain). 11/29/21   Wallis Bamberg, PA-C  Omega-3 Fatty Acids (OMEGA 3 PO) Take 1 capsule by mouth every morning.    [provider]  omeprazole (PRILOSEC) 40 MG capsule Take 40 mg by mouth every morning. 12/24/20   [provider]  oseltamivir (TAMIFLU) 75 MG capsule Take 1 capsule (75 mg total) by mouth every 12 (twelve)  hours. 12/19/22   Molpus, John, MD  Oxycodone HCl 10 MG TABS Take 1 tablet by mouth 5 (five) times daily as needed (pain).    [provider]  Vitamin D, Ergocalciferol, (DRISDOL) 1.25 MG (50000 UNIT) CAPS capsule Take 50,000 Units by mouth every Monday. 02/21/21   [provider]      Allergies    Peanuts [peanut oil]    Review of Systems   Review of Systems  Genitourinary:  Positive for flank pain.    Physical Exam Updated Vital Signs BP (!) 127/97 (BP Location: Right Arm)   Pulse 83   Temp 98.1 F (36.7 C) (Oral)   Resp 16   Ht 5\' 11"  (1.803 m)   Wt (!) 146.5 kg   SpO2 98%   BMI 45.05 kg/m  Physical Exam Vitals and nursing note reviewed.  Constitutional:      General: He is not in acute distress.    Appearance: Normal appearance. He is obese.  HENT:     Head: Normocephalic and atraumatic.     Nose: Nose normal.     Mouth/Throat:     Mouth: Mucous membranes are moist.     Pharynx: Oropharynx is clear.  Eyes:     Extraocular Movements: Extraocular movements intact.     Conjunctiva/sclera: Conjunctivae normal.  Cardiovascular:     Rate and Rhythm: Normal rate and regular rhythm.  Heart sounds: Normal heart sounds.  Pulmonary:     Effort: Pulmonary effort is normal.     Breath sounds: Normal breath sounds.  Abdominal:     General: Abdomen is flat.     Palpations: Abdomen is soft.     Tenderness: There is no abdominal tenderness. There is no right CVA tenderness or left CVA tenderness.  Musculoskeletal:        General: Normal range of motion.     Cervical back: Normal range of motion and neck supple.     Comments: No midline back tenderness Left thoracic paraspinal muscle tenderness to palpation without overlying skin changes  Skin:    General: Skin is warm and dry.     Findings: No rash.  Neurological:     General: No focal deficit present.     Mental Status: He is alert and oriented to person, place, and time.  Psychiatric:        Mood  and Affect: Mood normal.        Behavior: Behavior normal.     ED Results / Procedures / Treatments   Labs (all labs ordered are listed, but only abnormal results are displayed) Labs Reviewed  URINALYSIS, ROUTINE W REFLEX MICROSCOPIC - Abnormal; Notable for the following components:      Result Value   Leukocytes,Ua TRACE (*)    All other components within normal limits  CBG MONITORING, ED - Abnormal; Notable for the following components:   Glucose-Capillary 129 (*)    All other components within normal limits    EKG None  Radiology No results found.  Procedures Procedures    Medications Ordered in ED Medications - No data to display  ED Course/ Medical Decision Making/ A&P Clinical Course as of 01/07/23 1110  Wed Jan 07, 2023  1057 Urine negative for UTI, accucheck normal. No RBCs in the urine and exam inconsistent with kidney stone. Patient will be treated for MSK pain and recommended PCP follow up. [VK]    Clinical Course User Index [VK] Kemper Durie, DO                             Medical Decision Making This patient presents to the ED with chief complaint(s) of flank pain with pertinent past medical history of hypertension which further complicates the presenting complaint. The complaint involves an extensive differential diagnosis and also carries with it a high risk of complications and morbidity.    The differential diagnosis includes UTI, pyelonephritis, nephrolithiasis, muscle strain, no rash making shingles unlikely  Additional history obtained: Additional history obtained from N/A Records reviewed Primary Care Documents  ED Course and Reassessment: Patient was awake alert and well-appearing on arrival in no acute distress.  He has no significant CVA tenderness but does have some left-sided thoracic paraspinal muscle tenderness.  He declined any pain control.  He will have a urine and Accu-Chek performed in the setting of his urinary  frequency.  Independent labs interpretation:  The following labs were independently interpreted: Within normal range  Independent visualization of imaging: N/A  Consultation: - Consulted or discussed management/test interpretation w/ external professional: N/A  Consideration for admission or further workup: Patient has no emergent conditions requiring admission or further work-up at this time and is stable for discharge home with primary care follow-up  Social Determinants of health: N/A    Amount and/or Complexity of Data Reviewed Labs: ordered.  Final Clinical Impression(s) / ED Diagnoses Final diagnoses:  Left flank pain    Rx / DC Orders ED Discharge Orders     None         Kemper Durie, DO 01/07/23 1110

## 2023-01-07 NOTE — ED Notes (Signed)
CBG results given to Kristopher Dunn EMT-P

## 2023-01-07 NOTE — Discharge Instructions (Addendum)
You were seen in the emergency department for your flank pain.  He had no signs of kidney stone or kidney infection.  It is unclear what is causing your pain at this time but may be related to the muscle strain.  You can take Tylenol and Motrin as well as use ice and heat as needed for pain.  You should follow-up with your primary doctor in the next few days to have your symptoms rechecked.  You should return to the emergency department if your pain gets significantly worse, you have repetitive vomiting, you have fevers or if you have any other new or concerning symptoms.

## 2023-01-07 NOTE — ED Notes (Signed)
Pt aware urine specimen is needed, given urine specimen cup, and informed of nearest RR

## 2023-02-16 ENCOUNTER — Emergency Department (HOSPITAL_COMMUNITY)
Admission: EM | Admit: 2023-02-16 | Discharge: 2023-02-16 | Disposition: A | Discharge status: Home / Self Care | Payer: BLUE CROSS/BLUE SHIELD | Attending: Emergency Medicine | Admitting: Emergency Medicine

## 2023-02-16 ENCOUNTER — Other Ambulatory Visit: Payer: Self-pay

## 2023-02-16 DIAGNOSIS — Z91010 Allergy to peanuts: Secondary | ICD-10-CM | POA: Diagnosis not present

## 2023-02-16 DIAGNOSIS — A084 Viral intestinal infection, unspecified: Secondary | ICD-10-CM | POA: Insufficient documentation

## 2023-02-16 DIAGNOSIS — Z1152 Encounter for screening for COVID-19: Secondary | ICD-10-CM | POA: Diagnosis not present

## 2023-02-16 DIAGNOSIS — R197 Diarrhea, unspecified: Secondary | ICD-10-CM | POA: Diagnosis present

## 2023-02-16 LAB — COMPREHENSIVE METABOLIC PANEL
ALT: 36 U/L (ref 0–44)
AST: 27 U/L (ref 15–41)
Albumin: 3.9 g/dL (ref 3.5–5.0)
Alkaline Phosphatase: 48 U/L (ref 38–126)
Anion gap: 7 (ref 5–15)
BUN: 11 mg/dL (ref 6–20)
CO2: 27 mmol/L (ref 22–32)
Calcium: 9.4 mg/dL (ref 8.9–10.3)
Chloride: 102 mmol/L (ref 98–111)
Creatinine, Ser: 1.01 mg/dL (ref 0.61–1.24)
GFR, Estimated: >60 mL/min (ref >60)
Glucose, Bld: 107 mg/dL — ABNORMAL HIGH (ref 70–99)
Potassium: 4.2 mmol/L (ref 3.5–5.1)
Sodium: 136 mmol/L (ref 135–145)
Total Bilirubin: 0.7 mg/dL (ref 0.3–1.2)
Total Protein: 7.2 g/dL (ref 6.5–8.1)

## 2023-02-16 LAB — CBC WITH DIFFERENTIAL/PLATELET
Abs Immature Granulocytes: 0.02 10*3/uL (ref 0.00–0.07)
Basophils Absolute: 0 10*3/uL (ref 0.0–0.1)
Basophils Relative: 1 %
Eosinophils Absolute: 0.1 10*3/uL (ref 0.0–0.5)
Eosinophils Relative: 2 %
HCT: 43.4 % (ref 39.0–52.0)
Hemoglobin: 14.7 g/dL (ref 13.0–17.0)
Immature Granulocytes: 1 %
Lymphocytes Relative: 27 %
Lymphs Abs: 1.2 10*3/uL (ref 0.7–4.0)
MCH: 32.7 pg (ref 26.0–34.0)
MCHC: 33.9 g/dL (ref 30.0–36.0)
MCV: 96.7 fL (ref 80.0–100.0)
Monocytes Absolute: 0.5 10*3/uL (ref 0.1–1.0)
Monocytes Relative: 11 %
Neutro Abs: 2.6 10*3/uL (ref 1.7–7.7)
Neutrophils Relative %: 58 %
Platelets: 198 10*3/uL (ref 150–400)
RBC: 4.49 MIL/uL (ref 4.22–5.81)
RDW: 12.8 % (ref 11.5–15.5)
WBC: 4.4 10*3/uL (ref 4.0–10.5)
nRBC: 0 % (ref 0.0–0.2)

## 2023-02-16 LAB — RESP PANEL BY RT-PCR (RSV, FLU A&B, COVID)  RVPGX2
Influenza A by PCR: NEGATIVE
Influenza B by PCR: NEGATIVE
Resp Syncytial Virus by PCR: NEGATIVE
SARS Coronavirus 2 by RT PCR: NEGATIVE

## 2023-02-16 MED ORDER — ONDANSETRON 4 MG PO TBDP
8.0000 mg | ORAL_TABLET | Freq: Once | ORAL | Status: AC
  Administered 2023-02-16: 8 mg via ORAL
  Filled 2023-02-16: qty 2

## 2023-02-16 MED ORDER — SODIUM CHLORIDE 0.9 % IV BOLUS
1000.0000 mL | Freq: Once | INTRAVENOUS | Status: AC
  Administered 2023-02-16: 1000 mL via INTRAVENOUS

## 2023-02-16 NOTE — ED Provider Notes (Signed)
[facility] Provider Note   CSN: [id number] Arrival date & time: 02/16/23  1332     History  Chief Complaint  Patient presents with   Cough    Kristopher Dunn is a 42 y.o. male presented with diarrhea, nausea/vomiting, general weakness that began yesterday.  Patient states he recently ate at a new seafood restaurant and stated the food tasted funny in which he had oysters.  Patient Dors lower abdominal pain that is present when he is having diarrhea.  Patient stated he has not been able to keep food or fluid down for the past day and has been having nonbloody diarrhea constantly.  Patient denied fevers, chest pain, shortness of breath, hematemesis, recent travel  Home Medications Prior to Admission medications   Medication Sig Start Date End Date Taking? Authorizing Provider  albuterol (VENTOLIN HFA) 108 (90 Base) MCG/ACT inhaler Inhale 1-2 puffs into the lungs every 6 (six) hours as needed for wheezing or shortness of breath. 03/12/22   Charlott Rakes, MD  amLODipine (NORVASC) 5 MG tablet Take 1 tablet (5 mg total) by mouth daily. 03/12/22   Charlott Rakes, MD  atenolol (TENORMIN) 50 MG tablet Take 1 tablet by mouth once daily 07/31/22   Charlott Rakes, MD  gabapentin (NEURONTIN) 100 MG capsule Take 100 mg by mouth at bedtime. 02/21/21   [provider]  gabapentin (NEURONTIN) 300 MG capsule Take 300 mg by mouth at bedtime. 12/04/22   [provider]  naproxen (NAPROSYN) 500 MG tablet Take 1 tablet (500 mg total) by mouth 2 (two) times daily with a meal. Patient taking differently: Take 500 mg by mouth daily as needed (pain). 11/29/21   Jaynee Eagles, PA-C  Omega-3 Fatty Acids (OMEGA 3 PO) Take 1 capsule by mouth every morning.    [provider]  omeprazole (PRILOSEC) 40 MG capsule Take 40 mg by mouth every morning. 12/24/20   [provider]  oseltamivir (TAMIFLU) 75 MG capsule Take 1 capsule (75 mg total) by  mouth every 12 (twelve) hours. 12/19/22   Molpus, John, MD  Oxycodone HCl 10 MG TABS Take 1 tablet by mouth 5 (five) times daily as needed (pain).    [provider]  Vitamin D, Ergocalciferol, (DRISDOL) 1.25 MG (50000 UNIT) CAPS capsule Take 50,000 Units by mouth every Monday. 02/21/21   [provider]      Allergies    Peanuts [peanut oil]    Review of Systems   Review of Systems  Respiratory:  Positive for cough.   See HPI  Physical Exam Updated Vital Signs BP (!) 112/91   Pulse 67   Temp 98.2 F (36.8 C) (Oral)   Resp 18   SpO2 100%  Physical Exam Vitals and nursing note reviewed.  Constitutional:      General: He is not in acute distress.    Appearance: He is well-developed.  HENT:     Head: Normocephalic and atraumatic.     Mouth/Throat:     Mouth: Mucous membranes are moist.  Eyes:     Extraocular Movements: Extraocular movements intact.     Conjunctiva/sclera: Conjunctivae normal.     Pupils: Pupils are equal, round, and reactive to light.  Cardiovascular:     Rate and Rhythm: Normal rate and regular rhythm.     Heart sounds: No murmur heard. Pulmonary:     Effort: Pulmonary effort is normal. No respiratory distress.     Breath sounds: Normal breath sounds.  Abdominal:     General: There is no distension.     Palpations: Abdomen is soft.     Tenderness: There is abdominal tenderness (In all 4 quads). There is no guarding or rebound.  Musculoskeletal:        General: No swelling.     Cervical back: Neck supple.  Skin:    General: Skin is warm and dry.     Capillary Refill: Capillary refill takes less than 2 seconds.  Neurological:     General: No focal deficit present.     Mental Status: He is alert and oriented to person, place, and time.  Psychiatric:        Mood and Affect: Mood normal.     ED Results / Procedures / Treatments   Labs (all labs ordered are listed, but only abnormal results are displayed) Labs Reviewed   COMPREHENSIVE METABOLIC PANEL - Abnormal; Notable for the following components:      Result Value   Glucose, Bld 107 (*)    All other components within normal limits  RESP PANEL BY RT-PCR (RSV, FLU A&B, COVID)  RVPGX2  CBC WITH DIFFERENTIAL/PLATELET    EKG None  Radiology No results found.  Procedures Procedures    Medications Ordered in ED Medications  ondansetron (ZOFRAN-ODT) disintegrating tablet 8 mg (8 mg Oral Given 02/16/23 1648)  sodium chloride 0.9 % bolus 1,000 mL (0 mLs Intravenous Stopped 02/16/23 1803)    ED Course/ Medical Decision Making/ A&P                             Medical Decision Making  Kristopher Dunn 42 y.o. presented today for emesis and diarrhea. Working DDx that I considered at this time includes, but not limited to, viral gastroenteritis, URI, electrolyte imbalance, dehydration.  Review of prior external notes: None  Unique Tests and My Interpretation:  CMP: Unremarkable CBC: Unremarkable Respiratory panel: Negative  Discussion with Independent Historian: None  Discussion of Management of Tests: None  Risk: Low:  - based on diagnostic testing/clinical impression and treatment plan    Risk Stratification Score: None  R/o DDx: URI: Respiratory panel negative Electrolyte imbalance: CMP negative Dehydration: She does not appear to be dry and patient had moist mouth  Plan: Patient presented for diarrhea and emesis.  On exam patient did not appear to be in any distress and had reasonable vitals.  Patient stated that he did eat oysters before symptoms started and believes that is related to his symptoms.  Basic labs were ordered and fluids ordered to prevent dehydration.  Patient was given Zofran for his nausea.  Labs were unremarkable and patient was able to tolerate fluids p.o.  Patient stable for discharge at this time.  I spoke with the patient about the brat diet and the more the taking in fluids.  Patient be given a work note as  well.  Patient was given return precautions.patient stable for discharge at this time.  Patient verbalized understanding of plan.         Final Clinical Impression(s) / ED Diagnoses Final diagnoses:  Viral gastroenteritis    Rx / DC Orders ED Discharge Orders     None         Elvina Sidle 02/16/23 Darci Needle, MD 02/20/23 [phone]

## 2023-02-16 NOTE — ED Provider Triage Note (Signed)
Emergency Medicine Provider Triage Evaluation Note  Kristopher Dunn , Dunn 42 y.o. male  was evaluated in triage.  Pt complains of cough, sneezing, diarrhea onset last night.  Tried over-the-counter cough and cold medications for her symptoms.  Denies sick contacts.  Denies sore throat.   Review of Systems  Positive:  Negative:   Physical Exam  BP 137/77 (BP Location: Right Arm)   Pulse 83   Temp 98.2 F (36.8 C) (Oral)   Resp 18   SpO2 98%  Gen:   Awake, no distress   Resp:  Normal effort  MSK:   Moves extremities without difficulty  Other:    Medical Decision Making  Medically screening exam initiated at 1:39 PM.  Appropriate orders placed.  Kristopher Dunn was informed that the remainder of the evaluation will be completed by another provider, this initial triage assessment does not replace that evaluation, and the importance of remaining in the ED until their evaluation is complete.  Workup initiated   Kristopher Harshman A, PA-C 02/16/23 1340

## 2023-02-16 NOTE — ED Triage Notes (Signed)
Patient here for evaluation of cough, sneezing, and diarrhea since last night. Patient is alert, oriented, and in no apparent distress at this time.

## 2023-02-16 NOTE — Discharge Instructions (Signed)
Please make an appoint with primary care provider to be seen in the next few days regarding recent ER visit and to be reassessed.  Please continue to take in plenty of fluids and eat a diet consisting of bananas, rice, applesauce and toast as this should help with maintaining food intake.  If symptoms worsen please return to ER.

## 2023-02-23 ENCOUNTER — Encounter (HOSPITAL_COMMUNITY): Payer: Self-pay | Admitting: Emergency Medicine

## 2023-02-23 ENCOUNTER — Other Ambulatory Visit: Payer: Self-pay

## 2023-02-23 ENCOUNTER — Ambulatory Visit (HOSPITAL_COMMUNITY)
Admission: EM | Admit: 2023-02-23 | Discharge: 2023-02-23 | Disposition: A | Discharge status: Home / Self Care | Payer: BLUE CROSS/BLUE SHIELD | Attending: Physician Assistant | Admitting: Physician Assistant

## 2023-02-23 ENCOUNTER — Ambulatory Visit (INDEPENDENT_AMBULATORY_CARE_PROVIDER_SITE_OTHER): Payer: BLUE CROSS/BLUE SHIELD

## 2023-02-23 DIAGNOSIS — R0689 Other abnormalities of breathing: Secondary | ICD-10-CM

## 2023-02-23 DIAGNOSIS — J4521 Mild intermittent asthma with (acute) exacerbation: Secondary | ICD-10-CM | POA: Diagnosis not present

## 2023-02-23 MED ORDER — ALBUTEROL SULFATE HFA 108 (90 BASE) MCG/ACT IN AERS
2.0000 | INHALATION_SPRAY | Freq: Once | RESPIRATORY_TRACT | Status: AC
  Administered 2023-02-23: 2 via RESPIRATORY_TRACT

## 2023-02-23 MED ORDER — ALBUTEROL SULFATE HFA 108 (90 BASE) MCG/ACT IN AERS
INHALATION_SPRAY | RESPIRATORY_TRACT | Status: AC
  Filled 2023-02-23: qty 6.7

## 2023-02-23 MED ORDER — ALBUTEROL SULFATE HFA 108 (90 BASE) MCG/ACT IN AERS: 1.0000 | INHALATION_SPRAY | Freq: Four times a day (QID) | RESPIRATORY_TRACT | 0 refills | Status: DC | PRN

## 2023-02-23 MED ORDER — PREDNISONE 10 MG (21) PO TBPK: ORAL_TABLET | ORAL | 0 refills | Status: DC

## 2023-02-23 NOTE — Discharge Instructions (Signed)
Your x-ray was normal.  I have called in a refill of your albuterol inhaler.  I also want you to start a prednisone taper.  Do not take NSAIDs with this medication including aspirin, ibuprofen/Advil, naproxen/Aleve.  Follow-up with your primary care.  If you have any worsening symptoms including shortness of breath, worsening cough, fever, weakness you need to be seen immediately.
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
## 2023-02-23 NOTE — ED Triage Notes (Addendum)
Reports asthma exacerbation for 2 days.  Complains of lethargy, patient reports sob, sob worse with lying down.  Patient does not have any nebulizer or inhaler.     Patient has an appt with pcp coming up.  Speaking in complete sentences

## 2023-03-02 ENCOUNTER — Other Ambulatory Visit: Payer: Self-pay | Admitting: Family Medicine

## 2023-03-02 DIAGNOSIS — I10 Essential (primary) hypertension: Secondary | ICD-10-CM

## 2023-03-28 ENCOUNTER — Ambulatory Visit (HOSPITAL_COMMUNITY)
Admission: EM | Admit: 2023-03-28 | Discharge: 2023-03-28 | Disposition: A | Discharge status: Home / Self Care | Payer: BLUE CROSS/BLUE SHIELD | Attending: Physician Assistant | Admitting: Physician Assistant

## 2023-03-28 ENCOUNTER — Encounter (HOSPITAL_COMMUNITY): Payer: Self-pay

## 2023-03-28 ENCOUNTER — Ambulatory Visit (INDEPENDENT_AMBULATORY_CARE_PROVIDER_SITE_OTHER): Payer: BLUE CROSS/BLUE SHIELD

## 2023-03-28 DIAGNOSIS — M25562 Pain in left knee: Secondary | ICD-10-CM

## 2023-03-28 DIAGNOSIS — M1712 Unilateral primary osteoarthritis, left knee: Secondary | ICD-10-CM

## 2023-03-28 MED ORDER — MELOXICAM 15 MG PO TABS: 15.0000 mg | ORAL_TABLET | Freq: Every day | ORAL | 0 refills | Status: DC

## 2023-03-28 NOTE — ED Triage Notes (Signed)
Pt is here for left knee pain causing pain and swelling. Pt has tried elevation and ice to help but, knee to increase swelling x 5days .
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
## 2023-03-28 NOTE — Discharge Instructions (Signed)
I am concerned that you have arthritis in your knee.  Please use a brace for comfort and support.  Take Mobic daily..  Do not take additional NSAIDs with this medication including aspirin, ibuprofen/Advil, naproxen/Aleve.  You can use acetaminophen/Tylenol for breakthrough pain.  Follow-up with orthopedics if your symptoms or not improving; call to schedule an appointment.  If you have any increasing leg swelling, pain in your calf, chest pain, shortness of breath, heart racing you need to be seen immediately.

## 2023-03-28 NOTE — ED Notes (Signed)
Reviewed work note 

## 2023-04-04 ENCOUNTER — Other Ambulatory Visit: Payer: Self-pay

## 2023-04-04 ENCOUNTER — Encounter (HOSPITAL_BASED_OUTPATIENT_CLINIC_OR_DEPARTMENT_OTHER): Payer: Self-pay | Admitting: Emergency Medicine

## 2023-04-04 ENCOUNTER — Emergency Department (HOSPITAL_BASED_OUTPATIENT_CLINIC_OR_DEPARTMENT_OTHER)
Admission: EM | Admit: 2023-04-04 | Discharge: 2023-04-04 | Disposition: A | Discharge status: Home / Self Care | Payer: BLUE CROSS/BLUE SHIELD | Attending: Emergency Medicine | Admitting: Emergency Medicine

## 2023-04-04 DIAGNOSIS — Z91010 Allergy to peanuts: Secondary | ICD-10-CM | POA: Diagnosis not present

## 2023-04-04 DIAGNOSIS — R059 Cough, unspecified: Secondary | ICD-10-CM | POA: Diagnosis present

## 2023-04-04 DIAGNOSIS — U071 COVID-19: Secondary | ICD-10-CM | POA: Insufficient documentation

## 2023-04-04 DIAGNOSIS — E119 Type 2 diabetes mellitus without complications: Secondary | ICD-10-CM | POA: Insufficient documentation

## 2023-04-04 DIAGNOSIS — Z20822 Contact with and (suspected) exposure to covid-19: Secondary | ICD-10-CM

## 2023-04-04 LAB — RESP PANEL BY RT-PCR (RSV, FLU A&B, COVID)  RVPGX2
Influenza A by PCR: NEGATIVE
Influenza B by PCR: NEGATIVE
Resp Syncytial Virus by PCR: NEGATIVE
SARS Coronavirus 2 by RT PCR: POSITIVE — AB

## 2023-04-04 MED ORDER — MOLNUPIRAVIR 200 MG PO CAPS: 4.0000 | ORAL_CAPSULE | Freq: Two times a day (BID) | ORAL | 0 refills | Status: AC

## 2023-04-04 NOTE — ED Triage Notes (Signed)
Pt reports temp 100.8 ,cough/congestion since yesterday.

## 2023-04-04 NOTE — ED Provider Notes (Signed)
[place] EMERGENCY DEPARTMENT AT [facility] Provider Note   CSN: [id number] Arrival date & time: 04/04/23  1804     History  No chief complaint on file.   Kristopher Dunn is a 42 y.o. male who present complaining of flu-like symptoms: fevers, chills, myalgias, congestion, sore throat and cough for q1 days. Denies dyspnea or wheezing.   HPI     Home Medications Prior to Admission medications   Medication Sig Start Date End Date Taking? Authorizing Provider  albuterol (VENTOLIN HFA) 108 (90 Base) MCG/ACT inhaler Inhale 1-2 puffs into the lungs every 6 (six) hours as needed for wheezing or shortness of breath. 02/23/23   Raspet, Denny Peon K, PA-C  amLODipine (NORVASC) 5 MG tablet Take 1 tablet (5 mg total) by mouth daily. 03/12/22   Hoy Register, MD  atenolol (TENORMIN) 50 MG tablet Take 1 tablet by mouth once daily 07/31/22   Hoy Register, MD  gabapentin (NEURONTIN) 100 MG capsule Take 100 mg by mouth at bedtime. 02/21/21   [provider]  gabapentin (NEURONTIN) 300 MG capsule Take 300 mg by mouth at bedtime. 12/04/22   [provider]  meloxicam (MOBIC) 15 MG tablet Take 1 tablet (15 mg total) by mouth daily. 03/28/23   Raspet, Noberto Retort, PA-C  Omega-3 Fatty Acids (OMEGA 3 PO) Take 1 capsule by mouth every morning.    [provider]  omeprazole (PRILOSEC) 40 MG capsule Take 40 mg by mouth every morning. 12/24/20   [provider]  oseltamivir (TAMIFLU) 75 MG capsule Take 1 capsule (75 mg total) by mouth every 12 (twelve) hours. Patient not taking: Reported on 02/23/2023 12/19/22   Molpus, John, MD  Oxycodone HCl 10 MG TABS Take 1 tablet by mouth 5 (five) times daily as needed (pain).    [provider]  Vitamin D, Ergocalciferol, (DRISDOL) 1.25 MG (50000 UNIT) CAPS capsule Take 50,000 Units by mouth every Monday. 02/21/21   [provider]      Allergies    Peanuts [peanut oil]    Review of Systems   Review of  Systems  Physical Exam Updated Vital Signs BP 120/74   Pulse 70   Temp 98.5 F (36.9 C) (Oral)   Resp 16   SpO2 99%  Physical Exam Vitals and nursing note reviewed.  Constitutional:      General: He is not in acute distress.    Appearance: He is well-developed. He is ill-appearing. He is not toxic-appearing or diaphoretic.  HENT:     Head: Normocephalic and atraumatic.  Eyes:     General: No scleral icterus.    Conjunctiva/sclera: Conjunctivae normal.  Cardiovascular:     Rate and Rhythm: Normal rate and regular rhythm.     Heart sounds: Normal heart sounds.  Pulmonary:     Effort: Pulmonary effort is normal. No respiratory distress.     Breath sounds: Normal breath sounds.  Abdominal:     Palpations: Abdomen is soft.     Tenderness: There is no abdominal tenderness.  Musculoskeletal:     Cervical back: Normal range of motion and neck supple.  Skin:    General: Skin is warm and dry.  Neurological:     Mental Status: He is alert.  Psychiatric:        Behavior: Behavior normal.     ED Results / Procedures / Treatments   Labs (all labs ordered are listed, but only abnormal results are displayed) Labs Reviewed  RESP PANEL BY RT-PCR (RSV,  FLU A&B, COVID)  [id number]    EKG None  Radiology No results found.  Procedures Procedures    Medications Ordered in ED Medications - No data to display  ED Course/ Medical Decision Making/ A&P                             Medical Decision Making Patient here with flulike symptoms, positive for COVID, comorbidities include obesity, diabetes, chronic pain.  Patient unable to take Paxlovid due to occasion interactions however I have ordered molnupiravir.  Discussed outpatient follow-up and return turn precautions including Home isolation precautions.  Work note given.  Risk Prescription drug management.           Final Clinical Impression(s) / ED Diagnoses Final diagnoses:  Suspected COVID-19 virus infection     Rx / DC Orders ED Discharge Orders     None         Arthor Captain, PA-C 04/05/23 0003    Shon Baton, MD 04/05/23 [phone]

## 2023-04-04 NOTE — Discharge Instructions (Addendum)
Where to find more information Centers for Disease Control and Prevention (CDC): cdc.gov World Health Organization (WHO): who.int Get help right away if: You have trouble breathing. You have pain or pressure in your chest. You are confused. Your lips or fingernails turn blue. You have trouble waking from sleep. Your symptoms get worse. These symptoms may be an emergency. Get help right away. Call 911. Do not wait to see if the symptoms will go away. Do not drive yourself to the hospital. 

## 2023-04-06 ENCOUNTER — Telehealth: Payer: Self-pay | Admitting: Orthopedic Surgery

## 2023-04-06 NOTE — Telephone Encounter (Signed)
Called patient per mychart message to schedule an appointment for Dtap/Tdap/Td with Dr. August Saucer

## 2023-04-07 ENCOUNTER — Encounter (HOSPITAL_COMMUNITY): Payer: Self-pay

## 2023-04-07 ENCOUNTER — Emergency Department (HOSPITAL_COMMUNITY): Payer: BLUE CROSS/BLUE SHIELD

## 2023-04-07 ENCOUNTER — Emergency Department (HOSPITAL_COMMUNITY)
Admission: EM | Admit: 2023-04-07 | Discharge: 2023-04-07 | Disposition: A | Discharge status: Home / Self Care | Payer: BLUE CROSS/BLUE SHIELD | Attending: Emergency Medicine | Admitting: Emergency Medicine

## 2023-04-07 DIAGNOSIS — R0602 Shortness of breath: Secondary | ICD-10-CM | POA: Insufficient documentation

## 2023-04-07 DIAGNOSIS — Z91010 Allergy to peanuts: Secondary | ICD-10-CM | POA: Diagnosis not present

## 2023-04-07 DIAGNOSIS — D72819 Decreased white blood cell count, unspecified: Secondary | ICD-10-CM | POA: Insufficient documentation

## 2023-04-07 DIAGNOSIS — U071 COVID-19: Secondary | ICD-10-CM

## 2023-04-07 DIAGNOSIS — J45909 Unspecified asthma, uncomplicated: Secondary | ICD-10-CM | POA: Insufficient documentation

## 2023-04-07 LAB — CBC WITH DIFFERENTIAL/PLATELET
Abs Immature Granulocytes: 0.01 10*3/uL (ref 0.00–0.07)
Basophils Absolute: 0 10*3/uL (ref 0.0–0.1)
Basophils Relative: 1 %
Eosinophils Absolute: 0.1 10*3/uL (ref 0.0–0.5)
Eosinophils Relative: 3 %
HCT: 41.2 % (ref 39.0–52.0)
Hemoglobin: 13.9 g/dL (ref 13.0–17.0)
Immature Granulocytes: 0 %
Lymphocytes Relative: 22 %
Lymphs Abs: 0.7 10*3/uL (ref 0.7–4.0)
MCH: 32.1 pg (ref 26.0–34.0)
MCHC: 33.7 g/dL (ref 30.0–36.0)
MCV: 95.2 fL (ref 80.0–100.0)
Monocytes Absolute: 0.4 10*3/uL (ref 0.1–1.0)
Monocytes Relative: 13 %
Neutro Abs: 2 10*3/uL (ref 1.7–7.7)
Neutrophils Relative %: 61 %
Platelets: 168 10*3/uL (ref 150–400)
RBC: 4.33 MIL/uL (ref 4.22–5.81)
RDW: 12.7 % (ref 11.5–15.5)
WBC: 3.3 10*3/uL — ABNORMAL LOW (ref 4.0–10.5)
nRBC: 0 % (ref 0.0–0.2)

## 2023-04-07 LAB — COMPREHENSIVE METABOLIC PANEL
ALT: 38 U/L (ref 0–44)
AST: 30 U/L (ref 15–41)
Albumin: 3.7 g/dL (ref 3.5–5.0)
Alkaline Phosphatase: 43 U/L (ref 38–126)
Anion gap: 12 (ref 5–15)
BUN: 7 mg/dL (ref 6–20)
CO2: 23 mmol/L (ref 22–32)
Calcium: 9.1 mg/dL (ref 8.9–10.3)
Chloride: 102 mmol/L (ref 98–111)
Creatinine, Ser: 0.89 mg/dL (ref 0.61–1.24)
GFR, Estimated: >60 mL/min (ref >60)
Glucose, Bld: 114 mg/dL — ABNORMAL HIGH (ref 70–99)
Potassium: 3.9 mmol/L (ref 3.5–5.1)
Sodium: 137 mmol/L (ref 135–145)
Total Bilirubin: 0.7 mg/dL (ref 0.3–1.2)
Total Protein: 7.1 g/dL (ref 6.5–8.1)

## 2023-04-07 LAB — TROPONIN I (HIGH SENSITIVITY): Troponin I (High Sensitivity): 3 ng/L (ref <18)

## 2023-04-07 LAB — BRAIN NATRIURETIC PEPTIDE: B Natriuretic Peptide: 7.5 pg/mL (ref 0.0–100.0)

## 2023-04-07 MED ORDER — ALBUTEROL SULFATE HFA 108 (90 BASE) MCG/ACT IN AERS: 1.0000 | INHALATION_SPRAY | Freq: Four times a day (QID) | RESPIRATORY_TRACT | 0 refills | Status: DC | PRN

## 2023-04-07 MED ORDER — IOHEXOL 350 MG/ML SOLN
75.0000 mL | Freq: Once | INTRAVENOUS | Status: AC | PRN
  Administered 2023-04-07: 75 mL via INTRAVENOUS

## 2023-04-07 MED ORDER — ALBUTEROL SULFATE (2.5 MG/3ML) 0.083% IN NEBU
2.5000 mg | INHALATION_SOLUTION | Freq: Once | RESPIRATORY_TRACT | Status: AC
  Administered 2023-04-07: 2.5 mg via RESPIRATORY_TRACT
  Filled 2023-04-07: qty 3

## 2023-04-07 MED ORDER — IPRATROPIUM-ALBUTEROL 0.5-2.5 (3) MG/3ML IN SOLN
3.0000 mL | Freq: Once | RESPIRATORY_TRACT | Status: AC
  Administered 2023-04-07: 3 mL via RESPIRATORY_TRACT
  Filled 2023-04-07: qty 3

## 2023-04-07 NOTE — ED Provider Notes (Signed)
Accepted handoff at shift change from [place] PA-C. Please see prior provider note for full HPI.  Briefly: Patient is a 42 y.o. male who presents to the ER for shortness of breath in the setting of COVID-19 infection.  DDX/Plan: Follow up on BNP and CT PE study to evaluate for other causes of symptoms.  Physical Exam  BP 127/76 (BP Location: Right Arm)   Pulse 80   Temp 98.4 F (36.9 C) (Oral)   Resp 18   SpO2 99%   Physical Exam Vitals and nursing note reviewed.  Constitutional:      Appearance: Normal appearance.  HENT:     Head: Normocephalic and atraumatic.  Eyes:     Conjunctiva/sclera: Conjunctivae normal.  Cardiovascular:     Rate and Rhythm: Normal rate and regular rhythm.  Pulmonary:     Effort: Pulmonary effort is normal. No respiratory distress.     Breath sounds: Normal breath sounds.  Abdominal:     General: There is no distension.     Palpations: Abdomen is soft.     Tenderness: There is no abdominal tenderness.  Skin:    General: Skin is warm and dry.  Neurological:     General: No focal deficit present.     Mental Status: He is alert.    Results   Brain natriuretic peptide  Result Value Ref Range   B Natriuretic Peptide 7.5 0.0 - 100.0 pg/mL   CT Angio Chest PE W and/or Wo Contrast  Result Date: 04/07/2023 CLINICAL DATA:  Shortness of breath.  COVID positive. EXAM: CT ANGIOGRAPHY CHEST WITH CONTRAST TECHNIQUE: Multidetector CT imaging of the chest was performed using the standard protocol during bolus administration of intravenous contrast. Multiplanar CT image reconstructions and MIPs were obtained to evaluate the vascular anatomy. RADIATION DOSE REDUCTION: This exam was performed according to the departmental dose-optimization program which includes automated exposure control, adjustment of the mA and/or kV according to patient size and/or use of iterative reconstruction technique. CONTRAST:  75mL OMNIPAQUE IOHEXOL 350 MG/ML SOLN COMPARISON:  Chest  x-ray dated February 23, 2023. Cardiac CT dated April 18, 2021. CT chest dated July 26, 2012. FINDINGS: Cardiovascular: Suboptimal evaluation of the segmental pulmonary arteries due to contrast bolus timing and motion artifact. No evidence of central or lobar pulmonary embolism. Normal heart size. No pericardial effusion. No thoracic aortic aneurysm or dissection. Mediastinum/Nodes: No enlarged mediastinal, hilar, or axillary lymph nodes. Thyroid gland, trachea, and esophagus demonstrate no significant findings. Lungs/Pleura: No focal consolidation, pleural effusion, or pneumothorax. Upper Abdomen: No acute abnormality. Musculoskeletal: No chest wall abnormality. No acute or significant osseous findings. Review of the MIP images confirms the above findings. IMPRESSION: 1. Suboptimal evaluation of the segmental pulmonary arteries. No evidence of central or lobar pulmonary embolism. 2. No acute intrathoracic process. Electronically Signed   By: Obie Dredge M.D.   On: 04/07/2023 15:53   ED Course / MDM    Medical Decision Making Amount and/or Complexity of Data Reviewed Labs: ordered. Radiology: ordered.  Risk Prescription drug management.   BNP normal.  CT PE without definitive evidence of pulmonary embolism.  Suspect symptoms in the setting of COVID-19.  Patient feeling somewhat better after breathing treatment.  Will send prescription for albuterol inhaler to home.  He already received and is taking prescription for Paxlovid, started on 4/13.  Advised him to continue that, as well as ibuprofen and/or Tylenol as needed.  Close return precautions, discharged in stable condition.   Jayro Mcmath T, PA-C 04/07/23  1610    Derwood Kaplan, MD 04/07/23 [phone]

## 2023-04-07 NOTE — ED Notes (Signed)
Got patient into a gown on the monitor did EKG shown to Dr Pickering patient is resting with call bell in reach  

## 2023-04-07 NOTE — ED Triage Notes (Signed)
Pt bib ems coming from home. Pt reports SOB starting today. used nebulizer and MDI at home with no relief. Tested positive for covid 3 days ago. Pt also reports dizziness and anxiety.   EMS vitals: 98% room air Bp 135/85

## 2023-04-07 NOTE — Discharge Instructions (Addendum)
You were seen in the emergency department today for shortness of breath.  As we discussed your COVID-19 test is positive.  This is a viral illness and we normally treat with over-the-counter medications.  Symptoms can last for up to a week.    You can take ibuprofen or Tylenol for pain or fever, and I recommend alternating between the 2.  Make sure that you are drinking lots of fluids and getting plenty of rest. You can take decongestants as long as you take them with lots of water. You can use lozenges or chloraseptic spray as needed for sore throat.   Please use Tylenol or ibuprofen for pain.  You may use 600 mg ibuprofen every 6 hours or 1000 mg of Tylenol every 6 hours.  You may choose to alternate between the 2.  This would be most effective.  Do not exceed 4 g of Tylenol within 24 hours.  Do not exceed 3200 mg ibuprofen within 24 hours.  Continue to monitor how you are doing, and return to the emergency department for new or worsening symptoms such as chest pain, difficulty breathing not related to coughing, fever despite medication, or persistent vomiting or diarrhea.  It has been a pleasure taking care of you today and I hope you begin to feel better soon!

## 2023-04-07 NOTE — ED Provider Notes (Signed)
[place] EMERGENCY DEPARTMENT AT [facility] Provider Note   CSN: [id number] Arrival date & time:        History  Chief Complaint  Patient presents with   Shortness of Breath    Kristopher Dunn is a 42 y.o. male presenting for evaluation of shortness of breath  Pt states he was diagnosed with covid about 3 days ago. Since then, he has developed increasing shortness of breath, especially when laying flat. He has needed to sleep siting up in bed. He denies chest pain. He has associated nausea, no vomiting. He states his L leg has been more swollen He has taken his albuterol with short lived relief. He is taking the antiviral prescribed 3 days ago.   HPI     Home Medications Prior to Admission medications   Medication Sig Start Date End Date Taking? Authorizing Provider  albuterol (VENTOLIN HFA) 108 (90 Base) MCG/ACT inhaler Inhale 1-2 puffs into the lungs every 6 (six) hours as needed for wheezing or shortness of breath. 02/23/23   Raspet, Denny Peon K, PA-C  amLODipine (NORVASC) 5 MG tablet Take 1 tablet (5 mg total) by mouth daily. 03/12/22   Hoy Register, MD  atenolol (TENORMIN) 50 MG tablet Take 1 tablet by mouth once daily 07/31/22   Hoy Register, MD  gabapentin (NEURONTIN) 100 MG capsule Take 100 mg by mouth at bedtime. 02/21/21   [provider]  gabapentin (NEURONTIN) 300 MG capsule Take 300 mg by mouth at bedtime. 12/04/22   [provider]  meloxicam (MOBIC) 15 MG tablet Take 1 tablet (15 mg total) by mouth daily. 03/28/23   Raspet, Noberto Retort, PA-C  molnupiravir EUA (LAGEVRIO) 200 MG CAPS capsule Take 4 capsules (800 mg total) by mouth 2 (two) times daily for 5 days. 04/04/23 04/09/23  Arthor Captain, PA-C  Omega-3 Fatty Acids (OMEGA 3 PO) Take 1 capsule by mouth every morning.    [provider]  omeprazole (PRILOSEC) 40 MG capsule Take 40 mg by mouth every morning. 12/24/20   [provider]  oseltamivir (TAMIFLU) 75 MG capsule Take 1  capsule (75 mg total) by mouth every 12 (twelve) hours. Patient not taking: Reported on 02/23/2023 12/19/22   Molpus, John, MD  Oxycodone HCl 10 MG TABS Take 1 tablet by mouth 5 (five) times daily as needed (pain).    [provider]  Vitamin D, Ergocalciferol, (DRISDOL) 1.25 MG (50000 UNIT) CAPS capsule Take 50,000 Units by mouth every Monday. 02/21/21   [provider]      Allergies    Peanuts [peanut oil]    Review of Systems   Review of Systems  Constitutional:  Positive for fever.  Respiratory:  Positive for cough and shortness of breath.   Gastrointestinal:  Positive for nausea.  All other systems reviewed and are negative.   Physical Exam Updated Vital Signs BP 127/76 (BP Location: Right Arm)   Pulse 80   Temp 98.4 F (36.9 C) (Oral)   Resp 18   SpO2 99%  Physical Exam Vitals and nursing note reviewed.  Constitutional:      General: He is not in acute distress.    Appearance: Normal appearance. He is well-developed. He is obese.  HENT:     Head: Normocephalic and atraumatic.  Eyes:     Extraocular Movements: Extraocular movements intact.     Conjunctiva/sclera: Conjunctivae normal.     Pupils: Pupils are equal, round, and reactive to light.  Cardiovascular:  Rate and Rhythm: Normal rate and regular rhythm.     Pulses: Normal pulses.  Pulmonary:     Effort: Pulmonary effort is normal. No respiratory distress.     Breath sounds: Normal breath sounds. No wheezing.     Comments: Speaking in full sentences.  Clear lung sounds in all fields. Spo2 99% on RA Abdominal:     General: There is no distension.     Palpations: Abdomen is soft. There is no mass.     Tenderness: There is no abdominal tenderness. There is no guarding or rebound.  Musculoskeletal:        General: Normal range of motion.     Cervical back: Normal range of motion and neck supple.     Comments: No pitting edema. No ttp of the calf. No increased size of either lower extremity    Skin:    General: Skin is warm and dry.     Capillary Refill: Capillary refill takes less than 2 seconds.  Neurological:     Mental Status: He is alert and oriented to person, place, and time.  Psychiatric:        Mood and Affect: Mood and affect normal.        Speech: Speech normal.        Behavior: Behavior normal.     ED Results / Procedures / Treatments   Labs (all labs ordered are listed, but only abnormal results are displayed) Labs Reviewed  CBC WITH DIFFERENTIAL/PLATELET - Abnormal; Notable for the following components:      Result Value   WBC 3.3 (*)    All other components within normal limits  COMPREHENSIVE METABOLIC PANEL - Abnormal; Notable for the following components:   Glucose, Bld 114 (*)    All other components within normal limits  BRAIN NATRIURETIC PEPTIDE  TROPONIN I (HIGH SENSITIVITY)    EKG EKG Interpretation  Date/Time:  Tuesday April 07 2023 13:05:22 EDT Ventricular Rate:  78 PR Interval:    QRS Duration: 91 QT Interval:  350 QTC Calculation: 399 R Axis:   53 Text Interpretation: Normal sinus rhythm Confirmed by Benjiman Core [id number] on 04/07/2023 1:40:14 PM  Radiology No results found.  Procedures Procedures    Medications Ordered in ED Medications - No data to display  ED Course/ Medical Decision Making/ A&P                             Medical Decision Making Amount and/or Complexity of Data Reviewed Labs: ordered. Radiology: ordered.  Risk Prescription drug management.    This patient presents to the ED for concern of sob. This involves an extensive number of treatment options, and is a complaint that carries with it a high risk of complications and morbidity.  The differential diagnosis includes acs, PE, covid, superimposed bacterial infection, new onset HF   Co morbidities:  Asthma, recent covid infection, obesity   Lab Tests:  I ordered, and personally interpreted labs.  The pertinent results include:  wbc  slightly low at 3.3, c/w viral illness. Troponin negative at 3. As he has no new cp, no repeat needed. BNP pending at time of shift change   Imaging Studies:  I ordered imaging studies including CTA PE. Pending at time of shift change    Cardiac Monitoring:  The patient was maintained on a cardiac monitor.  I personally viewed and interpreted the cardiac monitored which showed an underlying rhythm of: NSR  Medicines ordered:  I ordered medication including duoneb  for sob  Disposition:  Patient signed out to L Roemhildt PA-C at shift change, pending BNP and CTA PE. Dispo plan to be determined based on results          Final Clinical Impression(s) / ED Diagnoses Final diagnoses:  None    Rx / DC Orders ED Discharge Orders     None         Alveria Apley, PA-C 04/07/23 1536    Benjiman Core, MD 04/08/23 1539

## 2023-04-14 ENCOUNTER — Ambulatory Visit: Payer: BLUE CROSS/BLUE SHIELD | Admitting: Family Medicine

## 2023-04-17 ENCOUNTER — Ambulatory Visit: Payer: Self-pay | Admitting: Cardiology

## 2023-04-29 ENCOUNTER — Ambulatory Visit: Payer: BLUE CROSS/BLUE SHIELD | Admitting: Physician Assistant

## 2023-05-03 ENCOUNTER — Emergency Department (HOSPITAL_COMMUNITY): Payer: BLUE CROSS/BLUE SHIELD

## 2023-05-03 ENCOUNTER — Ambulatory Visit (HOSPITAL_COMMUNITY)
Admission: EM | Admit: 2023-05-03 | Discharge: 2023-05-03 | Disposition: A | Discharge status: Home / Self Care | Payer: BLUE CROSS/BLUE SHIELD | Attending: Internal Medicine | Admitting: Internal Medicine

## 2023-05-03 ENCOUNTER — Encounter (HOSPITAL_COMMUNITY): Payer: Self-pay | Admitting: *Deleted

## 2023-05-03 ENCOUNTER — Emergency Department (HOSPITAL_COMMUNITY)
Admission: EM | Admit: 2023-05-03 | Discharge: 2023-05-03 | Disposition: A | Discharge status: Home / Self Care | Payer: BLUE CROSS/BLUE SHIELD | Attending: Emergency Medicine | Admitting: Emergency Medicine

## 2023-05-03 ENCOUNTER — Encounter (HOSPITAL_COMMUNITY): Payer: Self-pay | Admitting: Emergency Medicine

## 2023-05-03 ENCOUNTER — Other Ambulatory Visit: Payer: Self-pay

## 2023-05-03 DIAGNOSIS — Z91010 Allergy to peanuts: Secondary | ICD-10-CM | POA: Diagnosis not present

## 2023-05-03 DIAGNOSIS — R0602 Shortness of breath: Secondary | ICD-10-CM

## 2023-05-03 DIAGNOSIS — R0789 Other chest pain: Secondary | ICD-10-CM | POA: Diagnosis present

## 2023-05-03 DIAGNOSIS — I10 Essential (primary) hypertension: Secondary | ICD-10-CM | POA: Diagnosis not present

## 2023-05-03 DIAGNOSIS — R079 Chest pain, unspecified: Secondary | ICD-10-CM

## 2023-05-03 DIAGNOSIS — Z79899 Other long term (current) drug therapy: Secondary | ICD-10-CM | POA: Insufficient documentation

## 2023-05-03 LAB — BASIC METABOLIC PANEL
Anion gap: 14 (ref 5–15)
BUN: 13 mg/dL (ref 6–20)
CO2: 25 mmol/L (ref 22–32)
Calcium: 9.1 mg/dL (ref 8.9–10.3)
Chloride: 99 mmol/L (ref 98–111)
Creatinine, Ser: 0.87 mg/dL (ref 0.61–1.24)
GFR, Estimated: >60 mL/min (ref >60)
Glucose, Bld: 96 mg/dL (ref 70–99)
Potassium: 4.1 mmol/L (ref 3.5–5.1)
Sodium: 138 mmol/L (ref 135–145)

## 2023-05-03 LAB — CBC
HCT: 40.4 % (ref 39.0–52.0)
Hemoglobin: 13.5 g/dL (ref 13.0–17.0)
MCH: 31.6 pg (ref 26.0–34.0)
MCHC: 33.4 g/dL (ref 30.0–36.0)
MCV: 94.6 fL (ref 80.0–100.0)
Platelets: 201 10*3/uL (ref 150–400)
RBC: 4.27 MIL/uL (ref 4.22–5.81)
RDW: 12.8 % (ref 11.5–15.5)
WBC: 3.4 10*3/uL — ABNORMAL LOW (ref 4.0–10.5)
nRBC: 0 % (ref 0.0–0.2)

## 2023-05-03 LAB — HEPATIC FUNCTION PANEL
ALT: 40 U/L (ref 0–44)
AST: 27 U/L (ref 15–41)
Albumin: 3.8 g/dL (ref 3.5–5.0)
Alkaline Phosphatase: 47 U/L (ref 38–126)
Bilirubin, Direct: 0.1 mg/dL (ref 0.0–0.2)
Indirect Bilirubin: 0.8 mg/dL (ref 0.3–0.9)
Total Bilirubin: 0.9 mg/dL (ref 0.3–1.2)
Total Protein: 7.6 g/dL (ref 6.5–8.1)

## 2023-05-03 LAB — TROPONIN I (HIGH SENSITIVITY)
Troponin I (High Sensitivity): 2 ng/L (ref <18)
Troponin I (High Sensitivity): 2 ng/L (ref <18)

## 2023-05-03 MED ORDER — ONDANSETRON HCL 4 MG/2ML IJ SOLN
4.0000 mg | Freq: Once | INTRAMUSCULAR | Status: DC
  Filled 2023-05-03: qty 2

## 2023-05-03 NOTE — ED Notes (Signed)
Patient is being discharged from the Urgent Care and sent to the Emergency Department via POV . Per Juliet Rude NP, patient is in need of higher level of care due to Chest pain. Patient is aware and verbalizes understanding of plan of care.  Vitals:   05/03/23 1623  BP: (!) 146/94  Pulse: 72  Resp: (!) 21  Temp: 98.3 F (36.8 C)  SpO2: 93%

## 2023-05-03 NOTE — ED Triage Notes (Addendum)
Pt reports chest pain started one Hr ago  while sitting in church today.Pt reports mid chest pain that went to his back . Pt reports his lt arm felt funny but it went away. Pt reports he had COVID on April 04 2023

## 2023-05-03 NOTE — Discharge Instructions (Signed)
Take Tylenol for pain.  Follow-up with your family doctor this week for recheck
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
## 2023-05-03 NOTE — ED Triage Notes (Signed)
Pt arrived via POV, C/o CP that began this afternoon. Pt 650mg  of aspirin when it started. No new SOB.  AOx4

## 2023-05-03 NOTE — Discharge Instructions (Addendum)
Please go to the nearest ER for further workup and evaluation to rule out problems with your heart contributing to your chest pain.   Please go to [facility] ER.

## 2023-05-03 NOTE — ED Provider Notes (Signed)
[place] EMERGENCY DEPARTMENT AT [facility] Provider Note   CSN: [id number] Arrival date & time: 05/03/23  1734     History {Add pertinent medical, surgical, social history, OB history to HPI:1} Chief Complaint  Patient presents with   Chest Pain    Kristopher Dunn is a 42 y.o. male.  Patient states he has some chest discomfort today.  He has a history of hypertension   Chest Pain      Home Medications Prior to Admission medications   Medication Sig Start Date End Date Taking? Authorizing Provider  albuterol (VENTOLIN HFA) 108 (90 Base) MCG/ACT inhaler Inhale 1-2 puffs into the lungs every 6 (six) hours as needed for wheezing or shortness of breath. 04/07/23   Roemhildt, Lorin T, PA-C  amLODipine (NORVASC) 5 MG tablet Take 1 tablet (5 mg total) by mouth daily. 03/12/22   Hoy Register, MD  atenolol (TENORMIN) 50 MG tablet Take 1 tablet by mouth once daily 07/31/22   Hoy Register, MD  gabapentin (NEURONTIN) 100 MG capsule Take 100 mg by mouth at bedtime. 02/21/21   [provider]  gabapentin (NEURONTIN) 300 MG capsule Take 300 mg by mouth at bedtime. 12/04/22   [provider]  meloxicam (MOBIC) 15 MG tablet Take 1 tablet (15 mg total) by mouth daily. 03/28/23   Raspet, Noberto Retort, PA-C  Omega-3 Fatty Acids (OMEGA 3 PO) Take 1 capsule by mouth every morning.    [provider]  omeprazole (PRILOSEC) 40 MG capsule Take 40 mg by mouth every morning. 12/24/20   [provider]  oseltamivir (TAMIFLU) 75 MG capsule Take 1 capsule (75 mg total) by mouth every 12 (twelve) hours. Patient not taking: Reported on 02/23/2023 12/19/22   Molpus, John, MD  Oxycodone HCl 10 MG TABS Take 1 tablet by mouth 5 (five) times daily as needed (pain).    [provider]  Vitamin D, Ergocalciferol, (DRISDOL) 1.25 MG (50000 UNIT) CAPS capsule Take 50,000 Units by mouth every Monday. 02/21/21   [provider]      Allergies    Peanuts [peanut  oil]    Review of Systems   Review of Systems  Cardiovascular:  Positive for chest pain.    Physical Exam Updated Vital Signs BP (!) 137/93   Pulse 84   Temp 98.5 F (36.9 C) (Oral)   Resp 13   Ht 5\' 11"  (1.803 m)   Wt (!) 161 kg   SpO2 100%   BMI 49.51 kg/m  Physical Exam  ED Results / Procedures / Treatments   Labs (all labs ordered are listed, but only abnormal results are displayed) Labs Reviewed  CBC - Abnormal; Notable for the following components:      Result Value   WBC 3.4 (*)    All other components within normal limits  BASIC METABOLIC PANEL  HEPATIC FUNCTION PANEL  TROPONIN I (HIGH SENSITIVITY)  TROPONIN I (HIGH SENSITIVITY)    EKG None  Radiology DG Chest 2 View  Result Date: 05/03/2023 CLINICAL DATA:  Chest pain EXAM: CHEST - 2 VIEW COMPARISON:  None Available. FINDINGS: The heart size and mediastinal contours are within normal limits. Both lungs are clear. The visualized skeletal structures are unremarkable. IMPRESSION: No active cardiopulmonary disease. Electronically Signed   By: Gerome Sam III M.D.   On: 05/03/2023 18:19    Procedures Procedures  {Document cardiac monitor, telemetry assessment procedure when appropriate:1}  Medications Ordered in ED Medications  ondansetron (ZOFRAN) injection 4 mg (  4 mg Intravenous Patient Refused/Not Given 05/03/23 2059)    ED Course/ Medical Decision Making/ A&P   {   Click here for ABCD2, HEART and other calculatorsREFRESH Note before signing :1}                          Medical Decision Making Amount and/or Complexity of Data Reviewed Labs: ordered. Radiology: ordered.  Risk Prescription drug management.   Patient with 2 normal troponins and chest pain that has resolved.  Unlikely cardiac cause.  He will follow-up with his PCP  {Document critical care time when appropriate:1} {Document review of labs and clinical decision tools ie heart score, Chads2Vasc2 etc:1}  {Document your  independent review of radiology images, and any outside records:1} {Document your discussion with family members, caretakers, and with consultants:1} {Document social determinants of health affecting pt's care:1} {Document your decision making why or why not admission, treatments were needed:1} Final Clinical Impression(s) / ED Diagnoses Final diagnoses:  Atypical chest pain    Rx / DC Orders ED Discharge Orders     None
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
## 2023-05-03 NOTE — ED Triage Notes (Signed)
Pt reports he took 2 ASA 325mg  each.

## 2023-05-07 ENCOUNTER — Ambulatory Visit: Payer: Self-pay | Admitting: Cardiology

## 2023-05-09 ENCOUNTER — Other Ambulatory Visit: Payer: Self-pay

## 2023-05-09 ENCOUNTER — Emergency Department (HOSPITAL_COMMUNITY): Payer: BLUE CROSS/BLUE SHIELD

## 2023-05-09 ENCOUNTER — Emergency Department (HOSPITAL_COMMUNITY)
Admission: EM | Admit: 2023-05-09 | Discharge: 2023-05-10 | Discharge status: Against Medical Advice | Payer: BLUE CROSS/BLUE SHIELD | Attending: Emergency Medicine | Admitting: Emergency Medicine

## 2023-05-09 ENCOUNTER — Encounter (HOSPITAL_COMMUNITY): Payer: Self-pay

## 2023-05-09 DIAGNOSIS — Z5321 Procedure and treatment not carried out due to patient leaving prior to being seen by health care provider: Secondary | ICD-10-CM | POA: Insufficient documentation

## 2023-05-09 DIAGNOSIS — R0602 Shortness of breath: Secondary | ICD-10-CM | POA: Insufficient documentation

## 2023-05-09 DIAGNOSIS — J45909 Unspecified asthma, uncomplicated: Secondary | ICD-10-CM | POA: Insufficient documentation

## 2023-05-09 LAB — BASIC METABOLIC PANEL
Anion gap: 12 (ref 5–15)
BUN: 11 mg/dL (ref 6–20)
CO2: 22 mmol/L (ref 22–32)
Calcium: 9.2 mg/dL (ref 8.9–10.3)
Chloride: 104 mmol/L (ref 98–111)
Creatinine, Ser: 1.14 mg/dL (ref 0.61–1.24)
GFR, Estimated: >60 mL/min (ref >60)
Glucose, Bld: 139 mg/dL — ABNORMAL HIGH (ref 70–99)
Potassium: 3.9 mmol/L (ref 3.5–5.1)
Sodium: 138 mmol/L (ref 135–145)

## 2023-05-09 LAB — CBC
HCT: 40.7 % (ref 39.0–52.0)
Hemoglobin: 13.5 g/dL (ref 13.0–17.0)
MCH: 31.5 pg (ref 26.0–34.0)
MCHC: 33.2 g/dL (ref 30.0–36.0)
MCV: 95.1 fL (ref 80.0–100.0)
Platelets: 212 10*3/uL (ref 150–400)
RBC: 4.28 MIL/uL (ref 4.22–5.81)
RDW: 12.9 % (ref 11.5–15.5)
WBC: 4.5 10*3/uL (ref 4.0–10.5)
nRBC: 0 % (ref 0.0–0.2)

## 2023-05-09 LAB — TROPONIN I (HIGH SENSITIVITY): Troponin I (High Sensitivity): 3 ng/L (ref <18)

## 2023-05-09 NOTE — ED Triage Notes (Signed)
Pt arrives with c/o SOB that started about an hour ago. Pt used inhalers without relief. Pt denies CP. Pt has hx of asthma.

## 2023-05-09 NOTE — ED Notes (Signed)
Pt called to go to a room X4. Pt could not be found.

## 2023-08-06 ENCOUNTER — Other Ambulatory Visit: Payer: Self-pay

## 2023-08-06 ENCOUNTER — Emergency Department (HOSPITAL_COMMUNITY)
Admission: EM | Admit: 2023-08-06 | Discharge: 2023-08-06 | Disposition: A | Discharge status: Home / Self Care | Payer: BLUE CROSS/BLUE SHIELD | Attending: Emergency Medicine | Admitting: Emergency Medicine

## 2023-08-06 DIAGNOSIS — M79605 Pain in left leg: Secondary | ICD-10-CM | POA: Diagnosis present

## 2023-08-06 DIAGNOSIS — Z91010 Allergy to peanuts: Secondary | ICD-10-CM | POA: Insufficient documentation

## 2023-08-06 DIAGNOSIS — M5442 Lumbago with sciatica, left side: Secondary | ICD-10-CM | POA: Insufficient documentation

## 2023-08-06 NOTE — ED Triage Notes (Signed)
Patient from home for eval of pain and numbness to L side since waking yesterday morning. Patient ambulatory and states this is the worst pain he's ever felt despite taking his rx pain medication.

## 2023-08-06 NOTE — ED Notes (Signed)
Pt reports "burning" in left leg since awaking this morning.  Pt states he took oxycodone about an hour ago with no relief.

## 2023-08-06 NOTE — Discharge Instructions (Signed)
Take your home pain medicines as needed as previously directed. Follow-up with primary doctor or orthopedics to discuss further evaluation, physical therapy and possibly MRI if indicated. Return for fevers, leg weakness, difficulty with bowel or bladder function or new concerns.

## 2023-08-06 NOTE — ED Provider Notes (Signed)
[place] EMERGENCY DEPARTMENT AT [facility] Provider Note   CSN: [id number] Arrival date & time: 08/06/23  0831     History  Chief Complaint  Patient presents with   Leg Pain    Kristopher Dunn is a 42 y.o. male.  Patient with known disc herniation presents with worsening left-sided back pain radiating down side of thigh with numbness since waking this morning.  Patient has had back issues and herniated disc in the past nonsurgical.  No difficulty with bowel or bladder function.  Patient has home pain meds however this pain is more severe.  No weakness in the leg.  Patient has outpatient follow-up.  No new injuries.   Leg Pain Associated symptoms: no back pain, no fever and no neck pain        Home Medications Prior to Admission medications   Medication Sig Start Date End Date Taking? Authorizing Provider  albuterol (VENTOLIN HFA) 108 (90 Base) MCG/ACT inhaler Inhale 1-2 puffs into the lungs every 6 (six) hours as needed for wheezing or shortness of breath. 04/07/23   Roemhildt, Lorin T, PA-C  amLODipine (NORVASC) 5 MG tablet Take 1 tablet (5 mg total) by mouth daily. 03/12/22   Hoy Register, MD  atenolol (TENORMIN) 50 MG tablet Take 1 tablet by mouth once daily 07/31/22   Hoy Register, MD  gabapentin (NEURONTIN) 100 MG capsule Take 100 mg by mouth at bedtime. 02/21/21   [provider]  gabapentin (NEURONTIN) 300 MG capsule Take 300 mg by mouth at bedtime. 12/04/22   [provider]  meloxicam (MOBIC) 15 MG tablet Take 1 tablet (15 mg total) by mouth daily. 03/28/23   Raspet, Noberto Retort, PA-C  Omega-3 Fatty Acids (OMEGA 3 PO) Take 1 capsule by mouth every morning.    [provider]  omeprazole (PRILOSEC) 40 MG capsule Take 40 mg by mouth every morning. 12/24/20   [provider]  oseltamivir (TAMIFLU) 75 MG capsule Take 1 capsule (75 mg total) by mouth every 12 (twelve) hours. Patient not taking: Reported on 02/23/2023 12/19/22    Molpus, John, MD  Oxycodone HCl 10 MG TABS Take 1 tablet by mouth 5 (five) times daily as needed (pain).    [provider]  Vitamin D, Ergocalciferol, (DRISDOL) 1.25 MG (50000 UNIT) CAPS capsule Take 50,000 Units by mouth every Monday. 02/21/21   [provider]      Allergies    Peanuts [peanut oil]    Review of Systems   Review of Systems  Constitutional:  Negative for chills and fever.  HENT:  Negative for congestion.   Eyes:  Negative for visual disturbance.  Respiratory:  Negative for shortness of breath.   Cardiovascular:  Negative for chest pain.  Gastrointestinal:  Negative for abdominal pain and vomiting.  Genitourinary:  Negative for difficulty urinating, dysuria and flank pain.  Musculoskeletal:  Negative for back pain, neck pain and neck stiffness.  Skin:  Negative for rash.  Neurological:  Positive for numbness. Negative for weakness, light-headedness and headaches.    Physical Exam Updated Vital Signs BP 131/70 (BP Location: Right Arm)   Pulse 87   Temp 98.7 F (37.1 C) (Temporal)   Resp 19   SpO2 94%  Physical Exam Vitals and nursing note reviewed.  Constitutional:      General: He is not in acute distress.    Appearance: He is well-developed.  HENT:     Head: Normocephalic and atraumatic.     Mouth/Throat:  Mouth: Mucous membranes are moist.  Eyes:     General:        Right eye: No discharge.        Left eye: No discharge.     Conjunctiva/sclera: Conjunctivae normal.  Neck:     Trachea: No tracheal deviation.  Cardiovascular:     Rate and Rhythm: Normal rate.  Pulmonary:     Effort: Pulmonary effort is normal.  Abdominal:     General: There is no distension.     Palpations: Abdomen is soft.     Tenderness: There is no abdominal tenderness. There is no guarding.  Musculoskeletal:        General: Tenderness present. No swelling. Normal range of motion.     Cervical back: Normal range of motion and neck supple. No rigidity.      Comments: Patient has mild paraspinal tenderness lower lumbar region and buttocks on the left.  Patient has normal flexion and extension of hips knee ankle great toe on the left.  Sensation intact palpation and major nerves.  Normal pulses and perfusion distally.  No calf swelling or tenderness.  No leg edema.  Skin:    General: Skin is warm.     Capillary Refill: Capillary refill takes less than 2 seconds.     Findings: No rash.  Neurological:     General: No focal deficit present.     Mental Status: He is alert.     Cranial Nerves: No cranial nerve deficit.  Psychiatric:        Mood and Affect: Mood normal.     ED Results / Procedures / Treatments   Labs (all labs ordered are listed, but only abnormal results are displayed) Labs Reviewed - No data to display  EKG None  Radiology No results found.  Procedures Procedures    Medications Ordered in ED Medications - No data to display  ED Course/ Medical Decision Making/ A&P                                 Medical Decision Making  Patient with known disc herniation history and obesity presents with worsening signs and symptoms consistent with sciatica on the left.  Fortunately no weakness or bowel or bladder function difficulties.  Patient has been told he needs MRI outpatient for disc herniation.  No indication for emergent MRI at this time.  No infectious symptoms and no trauma to warrant further workup at this time.  Offered medicines however patient has pain meds at home.  Work note provided and discussed follow-up and patient comfortable plan.        Final Clinical Impression(s) / ED Diagnoses Final diagnoses:  Acute back pain with sciatica, left    Rx / DC Orders ED Discharge Orders     None         Blane Ohara, MD 08/06/23 [phone]

## 2023-08-12 ENCOUNTER — Encounter: Payer: Self-pay | Admitting: Physician Assistant

## 2023-08-12 ENCOUNTER — Ambulatory Visit: Payer: BLUE CROSS/BLUE SHIELD | Admitting: Physician Assistant

## 2023-08-12 ENCOUNTER — Other Ambulatory Visit (INDEPENDENT_AMBULATORY_CARE_PROVIDER_SITE_OTHER): Payer: BLUE CROSS/BLUE SHIELD

## 2023-08-12 DIAGNOSIS — G8929 Other chronic pain: Secondary | ICD-10-CM

## 2023-08-12 DIAGNOSIS — M25562 Pain in left knee: Secondary | ICD-10-CM | POA: Diagnosis not present

## 2023-08-12 DIAGNOSIS — M5442 Lumbago with sciatica, left side: Secondary | ICD-10-CM

## 2023-08-12 MED ORDER — METHYLPREDNISOLONE 4 MG PO TBPK: ORAL_TABLET | ORAL | 0 refills | Status: DC
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
## 2023-09-06 ENCOUNTER — Other Ambulatory Visit: Payer: Self-pay

## 2023-09-06 ENCOUNTER — Emergency Department (HOSPITAL_COMMUNITY): Payer: BLUE CROSS/BLUE SHIELD

## 2023-09-06 ENCOUNTER — Emergency Department (HOSPITAL_COMMUNITY)
Admission: EM | Admit: 2023-09-06 | Discharge: 2023-09-06 | Disposition: A | Discharge status: Home / Self Care | Payer: BLUE CROSS/BLUE SHIELD | Attending: Emergency Medicine | Admitting: Emergency Medicine

## 2023-09-06 DIAGNOSIS — J45909 Unspecified asthma, uncomplicated: Secondary | ICD-10-CM | POA: Insufficient documentation

## 2023-09-06 DIAGNOSIS — R072 Precordial pain: Secondary | ICD-10-CM | POA: Diagnosis present

## 2023-09-06 DIAGNOSIS — R0602 Shortness of breath: Secondary | ICD-10-CM | POA: Diagnosis not present

## 2023-09-06 DIAGNOSIS — I10 Essential (primary) hypertension: Secondary | ICD-10-CM | POA: Insufficient documentation

## 2023-09-06 DIAGNOSIS — Z8616 Personal history of COVID-19: Secondary | ICD-10-CM | POA: Insufficient documentation

## 2023-09-06 DIAGNOSIS — R002 Palpitations: Secondary | ICD-10-CM | POA: Diagnosis not present

## 2023-09-06 LAB — LIPASE, BLOOD: Lipase: 26 U/L (ref 11–51)

## 2023-09-06 LAB — HEPATIC FUNCTION PANEL
ALT: 51 U/L — ABNORMAL HIGH (ref 0–44)
AST: 35 U/L (ref 15–41)
Albumin: 3.9 g/dL (ref 3.5–5.0)
Alkaline Phosphatase: 48 U/L (ref 38–126)
Bilirubin, Direct: 0.2 mg/dL (ref 0.0–0.2)
Indirect Bilirubin: 0.8 mg/dL (ref 0.3–0.9)
Total Bilirubin: 1 mg/dL (ref 0.3–1.2)
Total Protein: 7.6 g/dL (ref 6.5–8.1)

## 2023-09-06 LAB — BASIC METABOLIC PANEL
Anion gap: 13 (ref 5–15)
BUN: 12 mg/dL (ref 6–20)
CO2: 23 mmol/L (ref 22–32)
Calcium: 9.4 mg/dL (ref 8.9–10.3)
Chloride: 101 mmol/L (ref 98–111)
Creatinine, Ser: 1 mg/dL (ref 0.61–1.24)
GFR, Estimated: >60 mL/min (ref >60)
Glucose, Bld: 110 mg/dL — ABNORMAL HIGH (ref 70–99)
Potassium: 4 mmol/L (ref 3.5–5.1)
Sodium: 137 mmol/L (ref 135–145)

## 2023-09-06 LAB — CBC
HCT: 44 % (ref 39.0–52.0)
Hemoglobin: 14.5 g/dL (ref 13.0–17.0)
MCH: 31.3 pg (ref 26.0–34.0)
MCHC: 33 g/dL (ref 30.0–36.0)
MCV: 94.8 fL (ref 80.0–100.0)
Platelets: 227 10*3/uL (ref 150–400)
RBC: 4.64 MIL/uL (ref 4.22–5.81)
RDW: 13.2 % (ref 11.5–15.5)
WBC: 4.9 10*3/uL (ref 4.0–10.5)
nRBC: 0 % (ref 0.0–0.2)

## 2023-09-06 LAB — TROPONIN I (HIGH SENSITIVITY)
Troponin I (High Sensitivity): 4 ng/L (ref <18)
Troponin I (High Sensitivity): 3 ng/L (ref <18)

## 2023-09-06 LAB — PROTIME-INR
INR: 0.9 (ref 0.8–1.2)
Prothrombin Time: 12.7 s (ref 11.4–15.2)

## 2023-09-06 MED ORDER — PANTOPRAZOLE SODIUM 40 MG PO TBEC: 40.0000 mg | DELAYED_RELEASE_TABLET | Freq: Every day | ORAL | 0 refills | Status: DC

## 2023-09-06 MED ORDER — MAALOX MAX 400-400-40 MG/5ML PO SUSP: 10.0000 mL | Freq: Four times a day (QID) | ORAL | 0 refills | Status: DC | PRN

## 2023-09-06 NOTE — Discharge Instructions (Signed)

## 2023-09-06 NOTE — ED Notes (Signed)
Discharge instructions discussed with pt. Verbalized understanding. VSS. No questions or concerns regarding discharge  
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
## 2023-09-06 NOTE — ED Triage Notes (Signed)
Patient reports central chest pain this evening with SOB and lightheadedness , no emesis or diaphoresis .

## 2023-09-07 ENCOUNTER — Emergency Department (HOSPITAL_COMMUNITY): Payer: BLUE CROSS/BLUE SHIELD

## 2023-09-07 ENCOUNTER — Other Ambulatory Visit: Payer: Self-pay

## 2023-09-07 ENCOUNTER — Encounter (HOSPITAL_COMMUNITY): Payer: Self-pay | Admitting: *Deleted

## 2023-09-07 ENCOUNTER — Emergency Department (HOSPITAL_COMMUNITY)
Admission: EM | Admit: 2023-09-07 | Discharge: 2023-09-07 | Disposition: A | Discharge status: Home / Self Care | Payer: BLUE CROSS/BLUE SHIELD | Attending: Emergency Medicine | Admitting: Emergency Medicine

## 2023-09-07 DIAGNOSIS — Z91010 Allergy to peanuts: Secondary | ICD-10-CM | POA: Insufficient documentation

## 2023-09-07 DIAGNOSIS — R072 Precordial pain: Secondary | ICD-10-CM | POA: Diagnosis not present

## 2023-09-07 DIAGNOSIS — R0602 Shortness of breath: Secondary | ICD-10-CM | POA: Diagnosis not present

## 2023-09-07 DIAGNOSIS — J45909 Unspecified asthma, uncomplicated: Secondary | ICD-10-CM | POA: Insufficient documentation

## 2023-09-07 DIAGNOSIS — I2510 Atherosclerotic heart disease of native coronary artery without angina pectoris: Secondary | ICD-10-CM | POA: Diagnosis not present

## 2023-09-07 DIAGNOSIS — I10 Essential (primary) hypertension: Secondary | ICD-10-CM | POA: Insufficient documentation

## 2023-09-07 DIAGNOSIS — Z8616 Personal history of COVID-19: Secondary | ICD-10-CM | POA: Insufficient documentation

## 2023-09-07 DIAGNOSIS — Z79899 Other long term (current) drug therapy: Secondary | ICD-10-CM | POA: Insufficient documentation

## 2023-09-07 DIAGNOSIS — R079 Chest pain, unspecified: Secondary | ICD-10-CM | POA: Diagnosis present

## 2023-09-07 HISTORY — DX: Unspecified osteoarthritis, unspecified site: M19.90

## 2023-09-07 HISTORY — DX: Atherosclerotic heart disease of native coronary artery without angina pectoris: I25.10

## 2023-09-07 HISTORY — DX: Sciatica, unspecified side: M54.30

## 2023-09-07 LAB — BASIC METABOLIC PANEL
Anion gap: 12 (ref 5–15)
BUN: 9 mg/dL (ref 6–20)
CO2: 23 mmol/L (ref 22–32)
Calcium: 9.4 mg/dL (ref 8.9–10.3)
Chloride: 104 mmol/L (ref 98–111)
Creatinine, Ser: 0.79 mg/dL (ref 0.61–1.24)
GFR, Estimated: >60 mL/min (ref >60)
Glucose, Bld: 127 mg/dL — ABNORMAL HIGH (ref 70–99)
Potassium: 3.8 mmol/L (ref 3.5–5.1)
Sodium: 139 mmol/L (ref 135–145)

## 2023-09-07 LAB — CBC
HCT: 42.5 % (ref 39.0–52.0)
Hemoglobin: 14.1 g/dL (ref 13.0–17.0)
MCH: 32 pg (ref 26.0–34.0)
MCHC: 33.2 g/dL (ref 30.0–36.0)
MCV: 96.6 fL (ref 80.0–100.0)
Platelets: 187 10*3/uL (ref 150–400)
RBC: 4.4 MIL/uL (ref 4.22–5.81)
RDW: 12.8 % (ref 11.5–15.5)
WBC: 3.3 10*3/uL — ABNORMAL LOW (ref 4.0–10.5)
nRBC: 0 % (ref 0.0–0.2)

## 2023-09-07 LAB — TROPONIN I (HIGH SENSITIVITY)
Troponin I (High Sensitivity): <2 ng/L (ref <18)
Troponin I (High Sensitivity): 3 ng/L (ref <18)

## 2023-09-07 MED ORDER — IOHEXOL 350 MG/ML SOLN
80.0000 mL | Freq: Once | INTRAVENOUS | Status: AC | PRN
  Administered 2023-09-07: 80 mL via INTRAVENOUS
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
## 2023-09-07 NOTE — ED Triage Notes (Addendum)
Pt reports 2 days of midsternal chest pain with [facility] and near syncope. Has tried to get appointment with Cardiology

## 2023-09-07 NOTE — Discharge Instructions (Signed)
You were seen in the emergency department today for chest pain.  As we discussed your lab work, EKG, chest x-ray all looked reassuring today.  I recommend monitoring your stress levels.  Continue to monitor how you are doing overall, and return to the emergency department for any new or worsening symptoms such as: Worsening pain or pain with exertion, difficulty breathing, sweating, or pain or swelling in your legs.  It is very important that you see your cardiologist at your earliest convenience for close follow-up.  Please call your primary doctor to schedule appointment as well.  Return if development of any new or worsening symptoms.

## 2023-09-15 ENCOUNTER — Emergency Department (HOSPITAL_BASED_OUTPATIENT_CLINIC_OR_DEPARTMENT_OTHER)
Admission: EM | Admit: 2023-09-15 | Discharge: 2023-09-15 | Disposition: A | Discharge status: Home / Self Care | Payer: BLUE CROSS/BLUE SHIELD

## 2023-09-15 ENCOUNTER — Other Ambulatory Visit: Payer: Self-pay

## 2023-09-15 ENCOUNTER — Encounter (HOSPITAL_BASED_OUTPATIENT_CLINIC_OR_DEPARTMENT_OTHER): Payer: Self-pay | Admitting: Emergency Medicine

## 2023-09-15 DIAGNOSIS — J45909 Unspecified asthma, uncomplicated: Secondary | ICD-10-CM | POA: Diagnosis not present

## 2023-09-15 DIAGNOSIS — Z1152 Encounter for screening for COVID-19: Secondary | ICD-10-CM | POA: Insufficient documentation

## 2023-09-15 DIAGNOSIS — Z91010 Allergy to peanuts: Secondary | ICD-10-CM | POA: Diagnosis not present

## 2023-09-15 DIAGNOSIS — R059 Cough, unspecified: Secondary | ICD-10-CM | POA: Diagnosis present

## 2023-09-15 DIAGNOSIS — B349 Viral infection, unspecified: Secondary | ICD-10-CM | POA: Insufficient documentation

## 2023-09-15 LAB — RESP PANEL BY RT-PCR (RSV, FLU A&B, COVID)  RVPGX2
Influenza A by PCR: NEGATIVE
Influenza B by PCR: NEGATIVE
Resp Syncytial Virus by PCR: NEGATIVE
SARS Coronavirus 2 by RT PCR: NEGATIVE
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
## 2023-09-15 NOTE — ED Provider Notes (Signed)
[place] EMERGENCY DEPARTMENT AT [facility] Provider Note   CSN: [id number] Arrival date & time: 09/15/23  1424     History  No chief complaint on file.   Kristopher Dunn Kristopher Dunn is a 42 y.o. male.  42 y.o male with a PMH of Asthma presents to the ED with a chief complaint of bodyaches, chills, cough which began yesterday.  Patient reports taking some Tylenol, over-the-counter medication without any improvement in his symptoms.  He is concerned as he felt some lack of appetite and felt that he likely had COVID-19.  He is also describing a scratchiness in his throat.  Did not recorded fever but has had subjective chills while at home.  Does have underlying asthma but reports no increase in his inhaler use.  No fever documented here, no shortness of breath, no chest pain.  The history is provided by the patient.       Home Medications Prior to Admission medications   Medication Sig Start Date End Date Taking? Authorizing Provider  albuterol (VENTOLIN HFA) 108 (90 Base) MCG/ACT inhaler Inhale 1-2 puffs into the lungs every 6 (six) hours as needed for wheezing or shortness of breath. 04/07/23   Roemhildt, Lorin T, PA-C  alum & mag hydroxide-simeth (MAALOX MAX) 400-400-40 MG/5ML suspension Take 10 mLs by mouth every 6 (six) hours as needed for indigestion. 09/06/23   Long, Arlyss Repress, MD  amLODipine (NORVASC) 5 MG tablet Take 1 tablet (5 mg total) by mouth daily. 03/12/22   Hoy Register, MD  atenolol (TENORMIN) 50 MG tablet Take 1 tablet by mouth once daily 07/31/22   Hoy Register, MD  gabapentin (NEURONTIN) 100 MG capsule Take 100 mg by mouth at bedtime. 02/21/21   [provider]  gabapentin (NEURONTIN) 300 MG capsule Take 300 mg by mouth at bedtime. 12/04/22   [provider]  meloxicam (MOBIC) 15 MG tablet Take 1 tablet (15 mg total) by mouth daily. 03/28/23   Raspet, Noberto Retort, PA-C  methylPREDNISolone (MEDROL DOSEPAK) 4 MG TBPK tablet Take as directed with food  08/12/23   Persons, [place], [place]  Omega-3 Fatty Acids (OMEGA 3 PO) Take 1 capsule by mouth every morning.    [provider]  omeprazole (PRILOSEC) 40 MG capsule Take 40 mg by mouth daily.    [provider]  oseltamivir (TAMIFLU) 75 MG capsule Take 1 capsule (75 mg total) by mouth every 12 (twelve) hours. Patient not taking: Reported on 02/23/2023 12/19/22   Molpus, John, MD  Oxycodone HCl 10 MG TABS Take 1 tablet by mouth 5 (five) times daily as needed (pain).    [provider]  pantoprazole (PROTONIX) 40 MG tablet Take 1 tablet (40 mg total) by mouth daily. 09/06/23   Long, Arlyss Repress, MD  Vitamin D, Ergocalciferol, (DRISDOL) 1.25 MG (50000 UNIT) CAPS capsule Take 50,000 Units by mouth every Monday. 02/21/21   [provider]      Allergies    Peanuts [peanut oil]    Review of Systems   Review of Systems  Constitutional:  Negative for fever.  HENT:  Positive for sore throat.   Respiratory:  Positive for cough. Negative for shortness of breath.   Cardiovascular:  Negative for chest pain and leg swelling.  Gastrointestinal:  Negative for abdominal pain.    Physical Exam Updated Vital Signs BP 129/82 (BP Location: Right Arm)   Pulse 65   Temp 97.8 F (36.6 C)   Resp 16   SpO2 100%  Physical  Exam Vitals and nursing note reviewed.  Constitutional:      Appearance: Normal appearance.  HENT:     Head: Normocephalic and atraumatic.     Nose: Nose normal.  Eyes:     Pupils: Pupils are equal, round, and reactive to light.  Cardiovascular:     Rate and Rhythm: Normal rate.  Pulmonary:     Effort: Pulmonary effort is normal.     Breath sounds: No decreased breath sounds or wheezing.     Comments: No wheezing or rales.  Chest:     Chest wall: No tenderness.  Abdominal:     General: Abdomen is flat.  Musculoskeletal:     Cervical back: Normal range of motion and neck supple.  Skin:    General: Skin is warm and dry.  Neurological:     Mental  Status: He is alert and oriented to person, place, and time.     ED Results / Procedures / Treatments   Labs (all labs ordered are listed, but only abnormal results are displayed) Labs Reviewed  RESP PANEL BY RT-PCR (RSV, FLU A&B, COVID)  [id number]    EKG None  Radiology No results found.  Procedures Procedures    Medications Ordered in ED Medications - No data to display  ED Course/ Medical Decision Making/ A&P                                 Medical Decision Making   Patient presents to the ED with a chief complaint of myalgias, cough, subjective fever and chills which began yesterday.  Denies any sick contacts, has not recorded an actual fever, taken over-the-counter medication without improvement in symptoms.  Underlying history of asthma, reports no increase in inhaler use, here without any wheezing, rales or diminished lung sounds.  No tachypnea, no tachycardia, no hypoxia.  Here without chest pain or shortness of breath, no leg swelling no prior history of CHF.  Respiratory panel is negative for influenza, COVID-19 today.  We discussed likely viral etiology he is afebrile here, will continue to treat symptoms at home with over-the-counter medication.  He is agreeable to plan and treatment, return precautions discussed at length.  Patient hemodynamically stable for discharge.  Portions of this note were generated with Scientist, clinical (histocompatibility and immunogenetics). Dictation errors may occur despite best attempts at proofreading.  Final Clinical Impression(s) / ED Diagnoses Final diagnoses:  Viral illness    Rx / DC Orders ED Discharge Orders     None         Claude Manges, PA-C 09/15/23 1639    Charlynne Pander, MD 09/15/23 2325

## 2023-09-15 NOTE — ED Notes (Signed)
Discharge paperwork given and verbally understood. 

## 2023-09-15 NOTE — Discharge Instructions (Addendum)
Your respiratory panel was negative on todays visit.   Please continue to treat your symptoms with over-the-counter medication.  If you experience any worsening symptoms please return to the emergency department.

## 2023-09-15 NOTE — ED Triage Notes (Signed)
Bodyaches chills cough.headache Sob Started yesterday

## 2023-09-16 ENCOUNTER — Ambulatory Visit: Payer: BLUE CROSS/BLUE SHIELD | Attending: Nurse Practitioner | Admitting: Nurse Practitioner

## 2023-09-16 DIAGNOSIS — R0789 Other chest pain: Secondary | ICD-10-CM

## 2023-09-16 DIAGNOSIS — M940 Chondrocostal junction syndrome [Tietze]: Secondary | ICD-10-CM

## 2023-09-16 DIAGNOSIS — Z8249 Family history of ischemic heart disease and other diseases of the circulatory system: Secondary | ICD-10-CM

## 2023-09-16 DIAGNOSIS — I10 Essential (primary) hypertension: Secondary | ICD-10-CM

## 2023-09-17 ENCOUNTER — Encounter: Payer: Self-pay | Admitting: Nurse Practitioner

## 2023-09-20 ENCOUNTER — Encounter (HOSPITAL_COMMUNITY): Payer: Self-pay

## 2023-09-20 ENCOUNTER — Other Ambulatory Visit: Payer: Self-pay

## 2023-09-20 ENCOUNTER — Emergency Department (HOSPITAL_COMMUNITY)
Admission: EM | Admit: 2023-09-20 | Discharge: 2023-09-20 | Disposition: A | Discharge status: Home / Self Care | Payer: BLUE CROSS/BLUE SHIELD | Attending: Emergency Medicine | Admitting: Emergency Medicine

## 2023-09-20 ENCOUNTER — Emergency Department (HOSPITAL_COMMUNITY): Payer: BLUE CROSS/BLUE SHIELD

## 2023-09-20 DIAGNOSIS — Z91010 Allergy to peanuts: Secondary | ICD-10-CM | POA: Insufficient documentation

## 2023-09-20 DIAGNOSIS — I2510 Atherosclerotic heart disease of native coronary artery without angina pectoris: Secondary | ICD-10-CM | POA: Diagnosis not present

## 2023-09-20 DIAGNOSIS — Z79899 Other long term (current) drug therapy: Secondary | ICD-10-CM | POA: Insufficient documentation

## 2023-09-20 DIAGNOSIS — J45901 Unspecified asthma with (acute) exacerbation: Secondary | ICD-10-CM | POA: Diagnosis not present

## 2023-09-20 DIAGNOSIS — I10 Essential (primary) hypertension: Secondary | ICD-10-CM | POA: Diagnosis not present

## 2023-09-20 DIAGNOSIS — J019 Acute sinusitis, unspecified: Secondary | ICD-10-CM | POA: Insufficient documentation

## 2023-09-20 DIAGNOSIS — J45909 Unspecified asthma, uncomplicated: Secondary | ICD-10-CM | POA: Diagnosis not present

## 2023-09-20 DIAGNOSIS — R059 Cough, unspecified: Secondary | ICD-10-CM | POA: Diagnosis present

## 2023-09-20 LAB — CBC WITH DIFFERENTIAL/PLATELET
Abs Immature Granulocytes: 0.01 10*3/uL (ref 0.00–0.07)
Basophils Absolute: 0 10*3/uL (ref 0.0–0.1)
Basophils Relative: 1 %
Eosinophils Absolute: 0.1 10*3/uL (ref 0.0–0.5)
Eosinophils Relative: 2 %
HCT: 40 % (ref 39.0–52.0)
Hemoglobin: 13.2 g/dL (ref 13.0–17.0)
Immature Granulocytes: 0 %
Lymphocytes Relative: 24 %
Lymphs Abs: 1 10*3/uL (ref 0.7–4.0)
MCH: 31.5 pg (ref 26.0–34.0)
MCHC: 33 g/dL (ref 30.0–36.0)
MCV: 95.5 fL (ref 80.0–100.0)
Monocytes Absolute: 0.5 10*3/uL (ref 0.1–1.0)
Monocytes Relative: 12 %
Neutro Abs: 2.6 10*3/uL (ref 1.7–7.7)
Neutrophils Relative %: 61 %
Platelets: 190 10*3/uL (ref 150–400)
RBC: 4.19 MIL/uL — ABNORMAL LOW (ref 4.22–5.81)
RDW: 13.2 % (ref 11.5–15.5)
WBC: 4.3 10*3/uL (ref 4.0–10.5)
nRBC: 0 % (ref 0.0–0.2)

## 2023-09-20 LAB — BASIC METABOLIC PANEL
Anion gap: 10 (ref 5–15)
BUN: 11 mg/dL (ref 6–20)
CO2: 24 mmol/L (ref 22–32)
Calcium: 9.2 mg/dL (ref 8.9–10.3)
Chloride: 103 mmol/L (ref 98–111)
Creatinine, Ser: 0.82 mg/dL (ref 0.61–1.24)
GFR, Estimated: >60 mL/min (ref >60)
Glucose, Bld: 131 mg/dL — ABNORMAL HIGH (ref 70–99)
Potassium: 3.7 mmol/L (ref 3.5–5.1)
Sodium: 137 mmol/L (ref 135–145)

## 2023-09-20 MED ORDER — PREDNISONE 20 MG PO TABS
60.0000 mg | ORAL_TABLET | Freq: Once | ORAL | Status: AC
  Administered 2023-09-20: 60 mg via ORAL
  Filled 2023-09-20: qty 3

## 2023-09-20 MED ORDER — AMOXICILLIN-POT CLAVULANATE 875-125 MG PO TABS: 1.0000 | ORAL_TABLET | Freq: Two times a day (BID) | ORAL | 0 refills | Status: DC

## 2023-09-20 MED ORDER — ALBUTEROL SULFATE (2.5 MG/3ML) 0.083% IN NEBU
10.0000 mg | INHALATION_SOLUTION | Freq: Once | RESPIRATORY_TRACT | Status: AC
  Administered 2023-09-20: 2.5 mg via RESPIRATORY_TRACT
  Filled 2023-09-20: qty 12

## 2023-09-20 MED ORDER — PREDNISONE 20 MG PO TABS: 40.0000 mg | ORAL_TABLET | Freq: Every day | ORAL | 0 refills | Status: DC

## 2023-09-20 MED ORDER — ONDANSETRON 4 MG PO TBDP: 4.0000 mg | ORAL_TABLET | Freq: Three times a day (TID) | ORAL | 0 refills | Status: DC | PRN

## 2023-09-20 NOTE — ED Triage Notes (Signed)
Patient here for evaluation of cough and congestion X 1 week. Pt states he feels worse today than previous.

## 2023-09-20 NOTE — Discharge Instructions (Addendum)
You were seen in the emergency room today for congestion and shortness of breath.  Your symptoms are consistent with asthma exacerbation.  I have sent a short course of steroids please take as prescribed. Since your sinus pressure has persisted, I have also sent an antibiotic to your pharmacy.  Please take as prescribed.  As you have an appointment with your primary care provider tomorrow, please follow-up for symptoms symptoms are everything is improving.  Please return to the emergency room if you have any new or worsening symptoms.

## 2023-09-20 NOTE — ED Provider Notes (Signed)
[place] EMERGENCY DEPARTMENT AT [facility] Provider Note   CSN: [id number] Arrival date & time: 09/20/23  4098     History  Chief Complaint  Patient presents with   Cough   Nasal Congestion    Kristopher Dunn is a 42 y.o. male past medical history of anxiety, asthma, hypertension, coronary artery disease presenting to the emergency room after approximately 10 days of congestion.  Patient initially felt the symptoms were improving but then started getting worse, continues to have sinus pressure and pain.  Patient also reports he started having increased shortness of breath the past 2 or 3 days, cough that is productive, increased use of inhaler.  Patient reports that shortness of breath is exertional.  Patients shortness of breath is not worse when he is lying down.  Does not have any lower extremity edema.  Patient states this feels like asthma exacerbations in the past.  Patient denies fevers, chills, chest pain.  Patient does not have past medical history of PE and no signs of current DVT.   Cough      Home Medications Prior to Admission medications   Medication Sig Start Date End Date Taking? Authorizing Provider  amoxicillin-clavulanate (AUGMENTIN) 875-125 MG tablet Take 1 tablet by mouth every 12 (twelve) hours. 09/20/23  Yes Rada Zegers, Horald Chestnut, PA-C  ondansetron (ZOFRAN-ODT) 4 MG disintegrating tablet Take 1 tablet (4 mg total) by mouth every 8 (eight) hours as needed for nausea or vomiting. 09/20/23  Yes Kelyn Ponciano N, PA-C  albuterol (VENTOLIN HFA) 108 (90 Base) MCG/ACT inhaler Inhale 1-2 puffs into the lungs every 6 (six) hours as needed for wheezing or shortness of breath. 04/07/23   Roemhildt, Lorin T, PA-C  alum & mag hydroxide-simeth (MAALOX MAX) 400-400-40 MG/5ML suspension Take 10 mLs by mouth every 6 (six) hours as needed for indigestion. 09/06/23   Long, Arlyss Repress, MD  amLODipine (NORVASC) 5 MG tablet Take 1 tablet (5 mg total) by mouth daily. 03/12/22    Hoy Register, MD  atenolol (TENORMIN) 50 MG tablet Take 1 tablet by mouth once daily 07/31/22   Hoy Register, MD  gabapentin (NEURONTIN) 100 MG capsule Take 100 mg by mouth at bedtime. 02/21/21   [provider]  gabapentin (NEURONTIN) 300 MG capsule Take 300 mg by mouth at bedtime. 12/04/22   [provider]  meloxicam (MOBIC) 15 MG tablet Take 1 tablet (15 mg total) by mouth daily. 03/28/23   Raspet, Noberto Retort, PA-C  methylPREDNISolone (MEDROL DOSEPAK) 4 MG TBPK tablet Take as directed with food 08/12/23   Persons, [place], [place]  Omega-3 Fatty Acids (OMEGA 3 PO) Take 1 capsule by mouth every morning.    [provider]  omeprazole (PRILOSEC) 40 MG capsule Take 40 mg by mouth daily.    [provider]  oseltamivir (TAMIFLU) 75 MG capsule Take 1 capsule (75 mg total) by mouth every 12 (twelve) hours. Patient not taking: Reported on 02/23/2023 12/19/22   Molpus, John, MD  Oxycodone HCl 10 MG TABS Take 1 tablet by mouth 5 (five) times daily as needed (pain).    [provider]  pantoprazole (PROTONIX) 40 MG tablet Take 1 tablet (40 mg total) by mouth daily. 09/06/23   Long, Arlyss Repress, MD  Vitamin D, Ergocalciferol, (DRISDOL) 1.25 MG (50000 UNIT) CAPS capsule Take 50,000 Units by mouth every Monday. 02/21/21   [provider]      Allergies    Peanuts [peanut oil]    Review of  Systems   Review of Systems  Respiratory:  Positive for cough.     Physical Exam Updated Vital Signs BP (!) 155/75 (BP Location: Right Arm)   Pulse 85   Temp 98.2 F (36.8 C) (Oral)   Resp 20   Ht 5\' 11"  (1.803 m)   Wt (!) 162.4 kg   SpO2 98%   BMI 49.93 kg/m  Physical Exam Vitals and nursing note reviewed.  Constitutional:      General: He is not in acute distress.    Appearance: He is not toxic-appearing.  HENT:     Head: Normocephalic and atraumatic.     Right Ear: Tympanic membrane, ear canal and external ear normal.     Left Ear: Tympanic membrane, ear  canal and external ear normal.     Nose: Congestion present.     Comments: Tenderness to palpation over maxillary sinuses bilaterally.    Mouth/Throat:     Pharynx: No oropharyngeal exudate or posterior oropharyngeal erythema.  Eyes:     General: No scleral icterus.    Conjunctiva/sclera: Conjunctivae normal.  Cardiovascular:     Rate and Rhythm: Normal rate and regular rhythm.     Pulses: Normal pulses.     Heart sounds: Normal heart sounds.  Pulmonary:     Effort: Pulmonary effort is normal. No respiratory distress.     Breath sounds: No stridor. Wheezing present. No rhonchi or rales.  Chest:     Chest wall: No tenderness.  Abdominal:     General: Abdomen is flat. Bowel sounds are normal.     Palpations: Abdomen is soft.     Tenderness: There is no abdominal tenderness.  Musculoskeletal:     Right lower leg: No edema.     Left lower leg: No edema.  Skin:    General: Skin is warm and dry.     Capillary Refill: Capillary refill takes less than 2 seconds.     Findings: No lesion.  Neurological:     General: No focal deficit present.     Mental Status: He is alert and oriented to person, place, and time. Mental status is at baseline.     Sensory: No sensory deficit.     Motor: No weakness.     ED Results / Procedures / Treatments   Labs (all labs ordered are listed, but only abnormal results are displayed) Labs Reviewed - No data to display  EKG None  Radiology No results found.  Procedures Procedures    Medications Ordered in ED Medications - No data to display  ED Course/ Medical Decision Making/ A&P                                 Medical Decision Making Amount and/or Complexity of Data Reviewed Labs: ordered. Radiology: ordered.  Risk Prescription drug management.   Kristopher Dunn 42 y.o. presented today for shortness of breath.  Working DDx that I considered at this time includes, but not limited to, COPD exacerbation, URI, viral illness,  anemia, ACS, PE, pneumonia, pleural effusion, lung mass.  R/o DDx: These are considered less likely due to history of present illness, physical exam, labs/imaging findings  Pmhx: Asthma  Review of prior external notes: NONE  Unique Tests and My Interpretation:  CBC: unremarkable BMP: unremarkable   CXR: Unremarkable    Problem List / ED Course / Critical interventions / Medication management  Patient presents emergency room with  approximately 10 days of sinus pressure, congestion as well as experiencing shortness of breath and cough that started 3 to 4 days ago.  Patient is having normal work of breathing, normal respiratory rate normal oxygen saturation on room air. Given continued symptoms and productive cough, basic labs and chest x-ray ordered.  Lab work and x-ray came back unremarkable, no signs of pneumonia.  Patient's lab and chest x-ray came back unremarkable.  Patient received steroids and albuterol here.  After treatment patient's symptoms had resolved.  Patient had no wheezing on exam.  No shortness of breath.  Patient felt comfortable going home and has follow-up with primary care scheduled for tomorrow.  Sent occasions to pharmacy. Given strict return precautions.  I ordered medication including Steroids, continuous albuterol   Reevaluation of the patient after these medicines showed that the patient improved Patients vitals assessed. Upon arrival patient is hemodynamically stable.  I have reviewed the patients home medicines and have made adjustments as needed    Plan: Sent patient with Augmentin and steroids. Follow up with PCP in 2-3days, patient reports seeing PCP tomorrow. Patient was given return precautions. Patient stable for discharge at this time.  Patient educated on sx/dx and verbalized understanding of plan. Will return to ER for new or worsening sx.          Final Clinical Impression(s) / ED Diagnoses Final diagnoses:  Mild asthma with exacerbation,  unspecified whether persistent  Acute sinusitis, recurrence not specified, unspecified location    Rx / DC Orders ED Discharge Orders          Ordered    amoxicillin-clavulanate (AUGMENTIN) 875-125 MG tablet  Every 12 hours        09/20/23 1040    ondansetron (ZOFRAN-ODT) 4 MG disintegrating tablet  Every 8 hours PRN        09/20/23 1040              Anastasio Wogan, Horald Chestnut, PA-C 09/20/23 1946    Maia Plan, MD 09/29/23 [phone]

## 2023-09-28 ENCOUNTER — Other Ambulatory Visit (HOSPITAL_BASED_OUTPATIENT_CLINIC_OR_DEPARTMENT_OTHER): Payer: Self-pay

## 2023-09-28 ENCOUNTER — Other Ambulatory Visit: Payer: Self-pay

## 2023-09-29 ENCOUNTER — Ambulatory Visit: Payer: BLUE CROSS/BLUE SHIELD | Admitting: Cardiology

## 2023-09-29 ENCOUNTER — Other Ambulatory Visit (HOSPITAL_BASED_OUTPATIENT_CLINIC_OR_DEPARTMENT_OTHER): Payer: Self-pay

## 2023-09-29 VITALS — BP 148/90 | HR 71 | Resp 16 | Ht 71.0 in | Wt 370.0 lb

## 2023-09-29 DIAGNOSIS — R072 Precordial pain: Secondary | ICD-10-CM

## 2023-09-29 MED ORDER — SEMAGLUTIDE-WEIGHT MANAGEMENT 0.25 MG/0.5ML ~~LOC~~ SOAJ
0.2500 mg | SUBCUTANEOUS | 0 refills | Status: DC
Start: 2023-09-24 — End: 2023-09-29
  Filled 2023-09-29: qty 2, 28d supply, fill #0

## 2023-09-30 ENCOUNTER — Encounter: Payer: Self-pay | Admitting: Cardiology

## 2023-09-30 ENCOUNTER — Other Ambulatory Visit: Payer: Self-pay

## 2023-09-30 ENCOUNTER — Encounter (HOSPITAL_BASED_OUTPATIENT_CLINIC_OR_DEPARTMENT_OTHER): Payer: Self-pay

## 2023-09-30 ENCOUNTER — Other Ambulatory Visit (HOSPITAL_BASED_OUTPATIENT_CLINIC_OR_DEPARTMENT_OTHER): Payer: Self-pay

## 2023-09-30 DIAGNOSIS — Z7982 Long term (current) use of aspirin: Secondary | ICD-10-CM | POA: Diagnosis not present

## 2023-09-30 DIAGNOSIS — Z79899 Other long term (current) drug therapy: Secondary | ICD-10-CM | POA: Diagnosis not present

## 2023-09-30 DIAGNOSIS — R739 Hyperglycemia, unspecified: Secondary | ICD-10-CM | POA: Diagnosis not present

## 2023-09-30 DIAGNOSIS — I2510 Atherosclerotic heart disease of native coronary artery without angina pectoris: Secondary | ICD-10-CM | POA: Insufficient documentation

## 2023-09-30 DIAGNOSIS — J45909 Unspecified asthma, uncomplicated: Secondary | ICD-10-CM | POA: Diagnosis not present

## 2023-09-30 DIAGNOSIS — I10 Essential (primary) hypertension: Secondary | ICD-10-CM | POA: Insufficient documentation

## 2023-09-30 DIAGNOSIS — F10929 Alcohol use, unspecified with intoxication, unspecified: Secondary | ICD-10-CM | POA: Insufficient documentation

## 2023-09-30 DIAGNOSIS — Z91010 Allergy to peanuts: Secondary | ICD-10-CM | POA: Insufficient documentation

## 2023-09-30 DIAGNOSIS — R110 Nausea: Secondary | ICD-10-CM | POA: Insufficient documentation

## 2023-09-30 DIAGNOSIS — Y905 Blood alcohol level of 100-119 mg/100 ml: Secondary | ICD-10-CM | POA: Diagnosis not present

## 2023-09-30 MED ORDER — WEGOVY 0.25 MG/0.5ML ~~LOC~~ SOAJ
0.2500 mg | SUBCUTANEOUS | 0 refills | Status: AC
  Filled 2023-09-30 – 2023-10-02 (×3): qty 2, 28d supply, fill #0

## 2023-09-30 NOTE — Progress Notes (Signed)
EKG Interpretation Date/Time:  Tuesday September 29 2023 11:46:17 EDT Ventricular Rate:  71 PR Interval:  192 QRS Duration:  102 QT Interval:  370 QTC Calculation: 402 R Axis:   48  Text Interpretation: Normal sinus rhythm Normal ECG When compared with ECG of 07-Sep-2023 12:52, No significant change since last tracing Confirmed by Tessa Lerner [phone]) on 09/30/2023 6:45:59 PM  After the completion of vital signs and EKG patient attended a telephone conversation and decided to cancel today's office visit and choose to leave without being seen by the provider.  Victoriano Campion [place], DO, [facility]
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
## 2023-10-01 ENCOUNTER — Other Ambulatory Visit (HOSPITAL_BASED_OUTPATIENT_CLINIC_OR_DEPARTMENT_OTHER): Payer: Self-pay

## 2023-10-01 ENCOUNTER — Emergency Department (HOSPITAL_BASED_OUTPATIENT_CLINIC_OR_DEPARTMENT_OTHER)
Admission: EM | Admit: 2023-10-01 | Discharge: 2023-10-01 | Disposition: A | Discharge status: Home / Self Care | Payer: BLUE CROSS/BLUE SHIELD | Attending: Emergency Medicine | Admitting: Emergency Medicine

## 2023-10-01 DIAGNOSIS — R110 Nausea: Secondary | ICD-10-CM

## 2023-10-01 DIAGNOSIS — F10920 Alcohol use, unspecified with intoxication, uncomplicated: Secondary | ICD-10-CM

## 2023-10-01 DIAGNOSIS — R739 Hyperglycemia, unspecified: Secondary | ICD-10-CM

## 2023-10-01 LAB — COMPREHENSIVE METABOLIC PANEL
ALT: 57 U/L — ABNORMAL HIGH (ref 0–44)
AST: 27 U/L (ref 15–41)
Albumin: 4.7 g/dL (ref 3.5–5.0)
Alkaline Phosphatase: 58 U/L (ref 38–126)
Anion gap: 13 (ref 5–15)
BUN: 12 mg/dL (ref 6–20)
CO2: 23 mmol/L (ref 22–32)
Calcium: 10 mg/dL (ref 8.9–10.3)
Chloride: 102 mmol/L (ref 98–111)
Creatinine, Ser: 0.85 mg/dL (ref 0.61–1.24)
GFR, Estimated: >60 mL/min (ref >60)
Glucose, Bld: 118 mg/dL — ABNORMAL HIGH (ref 70–99)
Potassium: 3.8 mmol/L (ref 3.5–5.1)
Sodium: 138 mmol/L (ref 135–145)
Total Bilirubin: 0.6 mg/dL (ref 0.3–1.2)
Total Protein: 8.3 g/dL — ABNORMAL HIGH (ref 6.5–8.1)

## 2023-10-01 LAB — RAPID URINE DRUG SCREEN, HOSP PERFORMED
Amphetamines: NOT DETECTED
Barbiturates: NOT DETECTED
Benzodiazepines: NOT DETECTED
Cocaine: POSITIVE — AB
Opiates: NOT DETECTED
Tetrahydrocannabinol: NOT DETECTED

## 2023-10-01 LAB — CBC
HCT: 43.7 % (ref 39.0–52.0)
Hemoglobin: 14.7 g/dL (ref 13.0–17.0)
MCH: 31.3 pg (ref 26.0–34.0)
MCHC: 33.6 g/dL (ref 30.0–36.0)
MCV: 93 fL (ref 80.0–100.0)
Platelets: 223 10*3/uL (ref 150–400)
RBC: 4.7 MIL/uL (ref 4.22–5.81)
RDW: 13.1 % (ref 11.5–15.5)
WBC: 5.6 10*3/uL (ref 4.0–10.5)
nRBC: 0 % (ref 0.0–0.2)

## 2023-10-01 LAB — ETHANOL: Alcohol, Ethyl (B): 115 mg/dL — ABNORMAL HIGH (ref <10)

## 2023-10-01 MED ORDER — ONDANSETRON 4 MG PO TBDP: 4.0000 mg | ORAL_TABLET | Freq: Three times a day (TID) | ORAL | 0 refills | Status: AC | PRN
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
## 2023-10-01 NOTE — Discharge Instructions (Addendum)
Your blood sugar is a little high.  Your primary care provider will need to follow that to make sure that if you develop diabetes, treatment is started promptly.
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
## 2023-10-02 ENCOUNTER — Other Ambulatory Visit (HOSPITAL_BASED_OUTPATIENT_CLINIC_OR_DEPARTMENT_OTHER): Payer: Self-pay

## 2023-10-02 ENCOUNTER — Other Ambulatory Visit (HOSPITAL_COMMUNITY): Payer: Self-pay

## 2023-10-02 ENCOUNTER — Ambulatory Visit (HOSPITAL_COMMUNITY)
Admission: EM | Admit: 2023-10-02 | Discharge: 2023-10-02 | Disposition: A | Discharge status: Home / Self Care | Payer: BLUE CROSS/BLUE SHIELD | Attending: Emergency Medicine | Admitting: Emergency Medicine

## 2023-10-02 ENCOUNTER — Encounter (HOSPITAL_COMMUNITY): Payer: Self-pay | Admitting: *Deleted

## 2023-10-02 DIAGNOSIS — J329 Chronic sinusitis, unspecified: Secondary | ICD-10-CM

## 2023-10-02 DIAGNOSIS — B9689 Other specified bacterial agents as the cause of diseases classified elsewhere: Secondary | ICD-10-CM | POA: Diagnosis not present

## 2023-10-02 DIAGNOSIS — H1031 Unspecified acute conjunctivitis, right eye: Secondary | ICD-10-CM

## 2023-10-02 MED ORDER — POLYMYXIN B-TRIMETHOPRIM 10000-0.1 UNIT/ML-% OP SOLN: 1.0000 [drp] | Freq: Four times a day (QID) | OPHTHALMIC | 0 refills | Status: AC

## 2023-10-02 MED ORDER — DOXYCYCLINE HYCLATE 100 MG PO CAPS: 100.0000 mg | ORAL_CAPSULE | Freq: Two times a day (BID) | ORAL | 0 refills | Status: DC

## 2023-10-02 NOTE — ED Triage Notes (Signed)
Pt states that he has a sinus infection. He is having facial pain and pressure since yesterday.  He also states he has something in his right eye that pain started when he woke up. He did take Tylenol   Pt declined to review chart states its all correct.

## 2023-10-02 NOTE — Discharge Instructions (Addendum)
Take all antibiotics as prescribed and until finished.  He can take antibiotics with food to help prevent gastrointestinal upset.  He can do Mucinex to help break up the mucus, 1200 mg daily.  Use the Polytrim drops 4 times daily to the right eye.  Clean your eyes before and after application of the drop.  You can also consider trying Pataday drops, these will help with your symptoms are allergic.  If your sinus pressure and pain does not improve despite the doxycycline, please follow-up with the ear nose and throat provider.  Return to clinic for any new or urgent symptoms.
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
## 2023-10-04 ENCOUNTER — Other Ambulatory Visit (HOSPITAL_BASED_OUTPATIENT_CLINIC_OR_DEPARTMENT_OTHER): Payer: Self-pay

## 2023-10-10 ENCOUNTER — Other Ambulatory Visit (HOSPITAL_BASED_OUTPATIENT_CLINIC_OR_DEPARTMENT_OTHER): Payer: Self-pay

## 2023-10-15 ENCOUNTER — Ambulatory Visit: Payer: BLUE CROSS/BLUE SHIELD | Attending: Cardiology | Admitting: Cardiology

## 2023-10-23 ENCOUNTER — Other Ambulatory Visit: Payer: Self-pay | Admitting: Medical Genetics

## 2023-10-23 DIAGNOSIS — Z006 Encounter for examination for normal comparison and control in clinical research program: Secondary | ICD-10-CM

## 2023-10-24 ENCOUNTER — Other Ambulatory Visit: Payer: Self-pay

## 2023-10-24 ENCOUNTER — Encounter (HOSPITAL_COMMUNITY): Payer: Self-pay | Admitting: Emergency Medicine

## 2023-10-24 ENCOUNTER — Ambulatory Visit (HOSPITAL_COMMUNITY)
Admission: EM | Admit: 2023-10-24 | Discharge: 2023-10-24 | Disposition: A | Discharge status: Home / Self Care | Payer: BLUE CROSS/BLUE SHIELD | Attending: Nurse Practitioner | Admitting: Nurse Practitioner

## 2023-10-24 DIAGNOSIS — H60502 Unspecified acute noninfective otitis externa, left ear: Secondary | ICD-10-CM

## 2023-10-24 MED ORDER — OFLOXACIN 0.3 % OT SOLN
10.0000 [drp] | Freq: Every day | OTIC | 0 refills | Status: AC
Start: 2023-10-24 — End: 2023-10-31

## 2023-10-24 NOTE — Discharge Instructions (Signed)
Apply the eardrops to your left ear daily for 7 days.  You can use a Vaseline tipped cottonball in your ear after applying the eardrops to prevent any water or foreign material from getting in your left ear.  Please seek care if symptoms do not improve with this treatment.
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
## 2023-10-24 NOTE — ED Triage Notes (Signed)
Left ear issues for 3 days.  Reports pain is inside and outside of ear.  Denies uri symptoms.  Patient has used peroxide in left ear.  Patient reports left side of face hurts

## 2023-11-24 ENCOUNTER — Ambulatory Visit: Payer: Self-pay | Admitting: Cardiology

## 2024-01-15 ENCOUNTER — Ambulatory Visit (HOSPITAL_COMMUNITY): Payer: BLUE CROSS/BLUE SHIELD

## 2024-01-28 ENCOUNTER — Emergency Department (HOSPITAL_COMMUNITY)
Admission: EM | Admit: 2024-01-28 | Discharge: 2024-01-28 | Disposition: A | Discharge status: Home / Self Care | Payer: BLUE CROSS/BLUE SHIELD | Attending: Emergency Medicine | Admitting: Emergency Medicine

## 2024-01-28 ENCOUNTER — Emergency Department (HOSPITAL_COMMUNITY): Payer: BLUE CROSS/BLUE SHIELD

## 2024-01-28 ENCOUNTER — Encounter (HOSPITAL_COMMUNITY): Payer: Self-pay | Admitting: Emergency Medicine

## 2024-01-28 DIAGNOSIS — I2510 Atherosclerotic heart disease of native coronary artery without angina pectoris: Secondary | ICD-10-CM | POA: Insufficient documentation

## 2024-01-28 DIAGNOSIS — R519 Headache, unspecified: Secondary | ICD-10-CM

## 2024-01-28 DIAGNOSIS — I10 Essential (primary) hypertension: Secondary | ICD-10-CM | POA: Insufficient documentation

## 2024-01-28 DIAGNOSIS — Z7982 Long term (current) use of aspirin: Secondary | ICD-10-CM | POA: Diagnosis not present

## 2024-01-28 DIAGNOSIS — J069 Acute upper respiratory infection, unspecified: Secondary | ICD-10-CM | POA: Diagnosis not present

## 2024-01-28 DIAGNOSIS — Z91010 Allergy to peanuts: Secondary | ICD-10-CM | POA: Diagnosis not present

## 2024-01-28 DIAGNOSIS — H532 Diplopia: Secondary | ICD-10-CM | POA: Diagnosis not present

## 2024-01-28 DIAGNOSIS — Z79899 Other long term (current) drug therapy: Secondary | ICD-10-CM | POA: Insufficient documentation

## 2024-01-28 DIAGNOSIS — R197 Diarrhea, unspecified: Secondary | ICD-10-CM | POA: Diagnosis not present

## 2024-01-28 DIAGNOSIS — Z794 Long term (current) use of insulin: Secondary | ICD-10-CM | POA: Diagnosis not present

## 2024-01-28 DIAGNOSIS — R42 Dizziness and giddiness: Secondary | ICD-10-CM | POA: Insufficient documentation

## 2024-01-28 LAB — LIPASE, BLOOD: Lipase: 28 U/L (ref 11–51)

## 2024-01-28 LAB — URINALYSIS, ROUTINE W REFLEX MICROSCOPIC
Bacteria, UA: NONE SEEN
Bilirubin Urine: NEGATIVE
Glucose, UA: NEGATIVE mg/dL
Hgb urine dipstick: NEGATIVE
Ketones, ur: NEGATIVE mg/dL
Nitrite: NEGATIVE
Protein, ur: NEGATIVE mg/dL
Specific Gravity, Urine: 1.009 (ref 1.005–1.030)
pH: 7 (ref 5.0–8.0)

## 2024-01-28 LAB — CBC
HCT: 45.1 % (ref 39.0–52.0)
Hemoglobin: 14.8 g/dL (ref 13.0–17.0)
MCH: 31.3 pg (ref 26.0–34.0)
MCHC: 32.8 g/dL (ref 30.0–36.0)
MCV: 95.3 fL (ref 80.0–100.0)
Platelets: 210 10*3/uL (ref 150–400)
RBC: 4.73 MIL/uL (ref 4.22–5.81)
RDW: 12.6 % (ref 11.5–15.5)
WBC: 3.7 10*3/uL — ABNORMAL LOW (ref 4.0–10.5)
nRBC: 0 % (ref 0.0–0.2)

## 2024-01-28 LAB — COMPREHENSIVE METABOLIC PANEL
ALT: 32 U/L (ref 0–44)
AST: 20 U/L (ref 15–41)
Albumin: 4.2 g/dL (ref 3.5–5.0)
Alkaline Phosphatase: 53 U/L (ref 38–126)
Anion gap: 11 (ref 5–15)
BUN: 9 mg/dL (ref 6–20)
CO2: 26 mmol/L (ref 22–32)
Calcium: 9.8 mg/dL (ref 8.9–10.3)
Chloride: 102 mmol/L (ref 98–111)
Creatinine, Ser: 0.93 mg/dL (ref 0.61–1.24)
GFR, Estimated: >60 mL/min (ref >60)
Glucose, Bld: 101 mg/dL — ABNORMAL HIGH (ref 70–99)
Potassium: 4.2 mmol/L (ref 3.5–5.1)
Sodium: 139 mmol/L (ref 135–145)
Total Bilirubin: 1 mg/dL (ref 0.0–1.2)
Total Protein: 7.8 g/dL (ref 6.5–8.1)

## 2024-01-28 MED ORDER — IOHEXOL 300 MG/ML  SOLN: 75.0000 mL | Freq: Once | INTRAMUSCULAR | Status: DC | PRN

## 2024-01-28 MED ORDER — MECLIZINE HCL 25 MG PO TABS
50.0000 mg | ORAL_TABLET | Freq: Once | ORAL | Status: AC
  Administered 2024-01-28: 50 mg via ORAL
  Filled 2024-01-28: qty 2

## 2024-01-28 MED ORDER — DIPHENHYDRAMINE HCL 50 MG/ML IJ SOLN
50.0000 mg | Freq: Once | INTRAMUSCULAR | Status: AC
  Administered 2024-01-28: 50 mg via INTRAVENOUS
  Filled 2024-01-28: qty 1

## 2024-01-28 MED ORDER — MECLIZINE HCL 25 MG PO TABS: 25.0000 mg | ORAL_TABLET | Freq: Three times a day (TID) | ORAL | 0 refills | Status: AC | PRN

## 2024-01-28 MED ORDER — SODIUM CHLORIDE 0.9 % IV BOLUS
1000.0000 mL | Freq: Once | INTRAVENOUS | Status: AC
  Administered 2024-01-28: 1000 mL via INTRAVENOUS

## 2024-01-28 MED ORDER — LORAZEPAM 2 MG/ML IJ SOLN: 1.0000 mg | Freq: Once | INTRAMUSCULAR | Status: DC | PRN

## 2024-01-28 MED ORDER — PROCHLORPERAZINE EDISYLATE 10 MG/2ML IJ SOLN
10.0000 mg | Freq: Once | INTRAMUSCULAR | Status: AC
  Administered 2024-01-28: 10 mg via INTRAVENOUS
  Filled 2024-01-28: qty 2

## 2024-01-28 NOTE — ED Triage Notes (Signed)
 Pt here from home with multiple complaints including headaches dizziness and n/v/d
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
## 2024-01-28 NOTE — Discharge Instructions (Signed)
 Your history, exam, and evaluation today did not reveal a clear cause of your headache and neurologic symptoms of dizziness and transient double vision.  We offered both a CT head, CT venogram, and MRI brain to rule out concerning causes such as tumor, bleed, stroke, dural venous sinus thrombus, or other causes however you report you are unable to tolerate imaging today.  We had a shared decision-making conversation and agreed to give you headache cocktail and some meclizine  to help if this was more of a vertigo type presentation and your symptoms improved with headache and dizziness.  With this in mind, we feel it is reasonable for discharge home with good return precautions if any symptoms were change or worsen and recommend follow-up with outpatient neurology.  You still likely need outpatient imaging but given the improved symptoms we feel you can discharge home today.  Please rest and stay hydrated and use the meclizine .  If any symptoms change or worsen acutely, please return to the nearest emergency department to continue your workup with imaging.

## 2024-02-01 ENCOUNTER — Other Ambulatory Visit (HOSPITAL_COMMUNITY)
Admission: RE | Admit: 2024-02-01 | Discharge: 2024-02-01 | Disposition: A | Discharge status: Home / Self Care | Payer: Self-pay | Attending: Oncology | Admitting: Oncology

## 2024-04-28 ENCOUNTER — Ambulatory Visit: Attending: Cardiology | Admitting: Cardiology

## 2024-05-17 ENCOUNTER — Emergency Department (HOSPITAL_COMMUNITY)
Admission: EM | Admit: 2024-05-17 | Discharge: 2024-05-17 | Disposition: A | Discharge status: Home / Self Care | Attending: Emergency Medicine | Admitting: Emergency Medicine

## 2024-05-17 ENCOUNTER — Emergency Department (HOSPITAL_COMMUNITY)

## 2024-05-17 ENCOUNTER — Encounter (HOSPITAL_COMMUNITY): Payer: Self-pay | Admitting: Emergency Medicine

## 2024-05-17 ENCOUNTER — Other Ambulatory Visit: Payer: Self-pay

## 2024-05-17 DIAGNOSIS — Z79899 Other long term (current) drug therapy: Secondary | ICD-10-CM | POA: Insufficient documentation

## 2024-05-17 DIAGNOSIS — Z91010 Allergy to peanuts: Secondary | ICD-10-CM | POA: Insufficient documentation

## 2024-05-17 DIAGNOSIS — R0789 Other chest pain: Secondary | ICD-10-CM | POA: Insufficient documentation

## 2024-05-17 DIAGNOSIS — I509 Heart failure, unspecified: Secondary | ICD-10-CM | POA: Insufficient documentation

## 2024-05-17 DIAGNOSIS — Z7982 Long term (current) use of aspirin: Secondary | ICD-10-CM | POA: Diagnosis not present

## 2024-05-17 DIAGNOSIS — J45909 Unspecified asthma, uncomplicated: Secondary | ICD-10-CM | POA: Insufficient documentation

## 2024-05-17 DIAGNOSIS — I110 Hypertensive heart disease with heart failure: Secondary | ICD-10-CM | POA: Insufficient documentation

## 2024-05-17 DIAGNOSIS — R079 Chest pain, unspecified: Secondary | ICD-10-CM | POA: Diagnosis present

## 2024-05-17 LAB — BASIC METABOLIC PANEL WITH GFR
Anion gap: 11 (ref 5–15)
BUN: 11 mg/dL (ref 6–20)
CO2: 24 mmol/L (ref 22–32)
Calcium: 9.3 mg/dL (ref 8.9–10.3)
Chloride: 104 mmol/L (ref 98–111)
Creatinine, Ser: 1 mg/dL (ref 0.61–1.24)
GFR, Estimated: >60 mL/min (ref >60)
Glucose, Bld: 123 mg/dL — ABNORMAL HIGH (ref 70–99)
Potassium: 4.2 mmol/L (ref 3.5–5.1)
Sodium: 139 mmol/L (ref 135–145)

## 2024-05-17 LAB — CBC
HCT: 41 % (ref 39.0–52.0)
Hemoglobin: 13.8 g/dL (ref 13.0–17.0)
MCH: 31.7 pg (ref 26.0–34.0)
MCHC: 33.7 g/dL (ref 30.0–36.0)
MCV: 94.3 fL (ref 80.0–100.0)
Platelets: 180 10*3/uL (ref 150–400)
RBC: 4.35 MIL/uL (ref 4.22–5.81)
RDW: 12.7 % (ref 11.5–15.5)
WBC: 4 10*3/uL (ref 4.0–10.5)
nRBC: 0 % (ref 0.0–0.2)

## 2024-05-17 LAB — TROPONIN I (HIGH SENSITIVITY)
Troponin I (High Sensitivity): 3 ng/L (ref <18)
Troponin I (High Sensitivity): 3 ng/L (ref <18)

## 2024-05-17 NOTE — ED Triage Notes (Signed)
 Pt reports left sided chest pressure and SOB woke him up this morning. Pt reports his left arm initially went numb but the feeling has since returned. Pt reports this happened two days ago as well.  No other symptoms.  Family hx of early MIs
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
## 2024-06-12 ENCOUNTER — Emergency Department (HOSPITAL_BASED_OUTPATIENT_CLINIC_OR_DEPARTMENT_OTHER)
Admission: EM | Admit: 2024-06-12 | Discharge: 2024-06-12 | Disposition: A | Discharge status: Home / Self Care | Attending: Emergency Medicine | Admitting: Emergency Medicine

## 2024-06-12 ENCOUNTER — Encounter (HOSPITAL_BASED_OUTPATIENT_CLINIC_OR_DEPARTMENT_OTHER): Payer: Self-pay | Admitting: Emergency Medicine

## 2024-06-12 ENCOUNTER — Other Ambulatory Visit: Payer: Self-pay

## 2024-06-12 DIAGNOSIS — Z91010 Allergy to peanuts: Secondary | ICD-10-CM | POA: Insufficient documentation

## 2024-06-12 DIAGNOSIS — Z7982 Long term (current) use of aspirin: Secondary | ICD-10-CM | POA: Insufficient documentation

## 2024-06-12 DIAGNOSIS — K0889 Other specified disorders of teeth and supporting structures: Secondary | ICD-10-CM | POA: Insufficient documentation

## 2024-06-12 MED ORDER — AMOXICILLIN-POT CLAVULANATE 875-125 MG PO TABS: 1.0000 | ORAL_TABLET | Freq: Two times a day (BID) | ORAL | 0 refills | Status: AC

## 2024-06-12 NOTE — ED Provider Notes (Signed)
  EMERGENCY DEPARTMENT AT [facility] Provider Note   CSN: [id number] Arrival date & time: 06/12/24  9277     Patient presents with: Oral Swelling   Kristopher Dunn is a 43 y.o. male.   Patient with complaint of left-sided upper tooth and lower tooth pain.  Been going on for about 3 days.  Patient is on chronic pain medicine.  Patient's contacted his dentist for follow-up.'s had problems in the area before and it was tooth related.  He is missing some teeth on that side.  No significant facial swelling.  No fevers.       Prior to Admission medications   Medication Sig Start Date End Date Taking? Authorizing Provider  amoxicillin -clavulanate (AUGMENTIN ) 875-125 MG tablet Take 1 tablet by mouth every 12 (twelve) hours. 06/12/24  Yes Venus Gilles, MD  albuterol  (VENTOLIN  HFA) 108 (90 Base) MCG/ACT inhaler Inhale 1-2 puffs into the lungs every 6 (six) hours as needed for wheezing or shortness of breath. 04/07/23   Roemhildt, Lorin T, PA-C  amLODipine  (NORVASC ) 5 MG tablet Take 1 tablet (5 mg total) by mouth daily. 03/12/22   Newlin, Enobong, MD  aspirin  EC 81 MG tablet Take 81 mg by mouth daily. Swallow whole.    [provider]  gabapentin (NEURONTIN) 100 MG capsule Take 100 mg by mouth at bedtime. 02/21/21   [provider]  meclizine  (ANTIVERT ) 25 MG tablet Take 1 tablet (25 mg total) by mouth 3 (three) times daily as needed for dizziness. 01/28/24   Tegeler, Lonni PARAS, MD  Omega-3 Fatty Acids (OMEGA 3 PO) Take 1 capsule by mouth every morning.    [provider]  omeprazole  (PRILOSEC) 40 MG capsule Take 40 mg by mouth daily.    [provider]  ondansetron  (ZOFRAN -ODT) 4 MG disintegrating tablet Take 1 tablet (4 mg total) by mouth every 8 (eight) hours as needed for nausea or vomiting. 10/01/23   Raford Lenis, MD  oxyCODONE  (ROXICODONE ) 15 MG immediate release tablet Take 1 tablet by mouth 5 (five) times daily as needed (pain).     [provider]  Semaglutide -Weight Management (WEGOVY ) 0.25 MG/0.5ML SOAJ Inject 0.25 milligram under skin weekly 09/30/23     Semaglutide -Weight Management 0.25 MG/0.5ML SOAJ Inject 0.25 mg into the skin once a week.    [provider]  Vitamin D, Ergocalciferol, (DRISDOL) 1.25 MG (50000 UNIT) CAPS capsule Take 50,000 Units by mouth every Monday. 02/21/21   [provider]    Allergies: Peanuts [peanut oil]    Review of Systems  Constitutional:  Negative for chills and fever.  HENT:  Positive for dental problem and facial swelling. Negative for ear pain, sore throat, trouble swallowing and voice change.   Eyes:  Negative for pain and visual disturbance.  Respiratory:  Negative for cough and shortness of breath.   Cardiovascular:  Negative for chest pain and palpitations.  Gastrointestinal:  Negative for abdominal pain and vomiting.  Genitourinary:  Negative for dysuria and hematuria.  Musculoskeletal:  Negative for arthralgias and back pain.  Skin:  Negative for color change and rash.  Neurological:  Negative for seizures and syncope.  All other systems reviewed and are negative.   Updated Vital Signs BP 135/84   Pulse 78   Temp 98.1 F (36.7 C)   Resp 12   Wt (!) 157.9 kg   SpO2 98%   BMI 48.54 kg/m   Physical Exam Vitals and nursing note reviewed.  Constitutional:  General: He is not in acute distress.    Appearance: Normal appearance. He is well-developed. He is not ill-appearing.  HENT:     Head: Normocephalic and atraumatic.     Mouth/Throat:     Comments: Some remnants of teeth upper and lower left side.  No significant gum swelling but there is tenderness in that area.  No swelling to the floor the mouth.  No significant facial swelling.  No adenopathy.  Uvula midline.  Oropharynx clear otherwise.  Eyes:     Extraocular Movements: Extraocular movements intact.     Conjunctiva/sclera: Conjunctivae normal.     Pupils: Pupils are equal,  round, and reactive to light.    Cardiovascular:     Rate and Rhythm: Normal rate and regular rhythm.     Heart sounds: No murmur heard. Pulmonary:     Effort: Pulmonary effort is normal. No respiratory distress.     Breath sounds: Normal breath sounds.  Abdominal:     Palpations: Abdomen is soft.     Tenderness: There is no abdominal tenderness.   Musculoskeletal:        General: No swelling.     Cervical back: Normal range of motion and neck supple.   Skin:    General: Skin is warm and dry.     Capillary Refill: Capillary refill takes less than 2 seconds.   Neurological:     General: No focal deficit present.     Mental Status: He is alert and oriented to person, place, and time.   Psychiatric:        Mood and Affect: Mood normal.     (all labs ordered are listed, but only abnormal results are displayed) Labs Reviewed - No data to display  EKG: None  Radiology: No results found.   Procedures   Medications Ordered in the ED - No data to display                                  Medical Decision Making Risk Prescription drug management.   Seems to be consistent with dental infection.  Will start patient on Augmentin .  He will continue his chronic pain medicine and recommending extra strength Tylenol .  He has plans to follow-up with his dentist already.   Final diagnoses:  Pain, dental    ED Discharge Orders          Ordered    amoxicillin -clavulanate (AUGMENTIN ) 875-125 MG tablet  Every 12 hours        06/12/24 0811               Claudie Brickhouse, MD 06/12/24 [phone]

## 2024-06-12 NOTE — ED Notes (Signed)
 Patient given discharge instructions. Questions were answered. Patient verbalized understanding of discharge instructions and care at home.

## 2024-06-12 NOTE — Discharge Instructions (Signed)
 Take the antibiotic Augmentin  as directed for the next few days.  Would recommend adding on extra strength Tylenol  2 tablets every 8 hours to your pain medication.  Follow-up with your dentist as planned.  Return for any new or worse symptoms.  Work note provided if needed.

## 2024-06-12 NOTE — ED Triage Notes (Signed)
 Left side face hurting/ swollen for 3 days, says he has had an infection on that side earlier in the year. No fevers

## 2024-07-03 ENCOUNTER — Other Ambulatory Visit: Payer: Self-pay

## 2024-07-03 ENCOUNTER — Emergency Department (HOSPITAL_COMMUNITY)
Admission: EM | Admit: 2024-07-03 | Discharge: 2024-07-03 | Disposition: A | Discharge status: Home / Self Care | Attending: Emergency Medicine | Admitting: Emergency Medicine

## 2024-07-03 ENCOUNTER — Encounter (HOSPITAL_COMMUNITY): Payer: Self-pay | Admitting: *Deleted

## 2024-07-03 DIAGNOSIS — K0889 Other specified disorders of teeth and supporting structures: Secondary | ICD-10-CM

## 2024-07-03 DIAGNOSIS — K029 Dental caries, unspecified: Secondary | ICD-10-CM | POA: Diagnosis not present

## 2024-07-03 MED ORDER — CLINDAMYCIN HCL 150 MG PO CAPS
300.0000 mg | ORAL_CAPSULE | Freq: Once | ORAL | Status: AC
  Administered 2024-07-03: 300 mg via ORAL
  Filled 2024-07-03: qty 2

## 2024-07-03 MED ORDER — CLINDAMYCIN HCL 300 MG PO CAPS: 300.0000 mg | ORAL_CAPSULE | Freq: Three times a day (TID) | ORAL | 0 refills | Status: AC

## 2024-07-03 NOTE — ED Provider Notes (Signed)
 [place] EMERGENCY DEPARTMENT AT [facility] Provider Note   CSN: [id number] Arrival date & time: 07/03/24  0221     History Chief Complaint  Patient presents with   Dental Pain    HPI Kristopher Dunn is a 43 y.o. male presenting for dental pain.  43 year old male.  Intermittent over the past few months.  Had gotten Augmentin  last month and improved has still not followed up with dentistry.  Denies fevers chills nausea vomiting shortness of breath.  Tolerating p.o. intake.  Discomfort mostly at night..   Patient's recorded medical, surgical, social, medication list and allergies were reviewed in the Snapshot window as part of the initial history.   Review of Systems   Review of Systems  Constitutional:  Negative for chills and fever.  HENT:  Positive for dental problem. Negative for ear pain and sore throat.   Eyes:  Negative for pain and visual disturbance.  Respiratory:  Negative for cough and shortness of breath.   Cardiovascular:  Negative for chest pain and palpitations.  Gastrointestinal:  Negative for abdominal pain and vomiting.  Genitourinary:  Negative for dysuria and hematuria.  Musculoskeletal:  Negative for arthralgias and back pain.  Skin:  Negative for color change and rash.  Neurological:  Negative for seizures and syncope.  All other systems reviewed and are negative.   Physical Exam Updated Vital Signs BP 138/77 (BP Location: Right Arm)   Pulse 81   Temp 98.7 F (37.1 C)   Resp 16   Ht 5' 11 (1.803 m)   Wt (!) 157.9 kg   SpO2 95%   BMI 48.55 kg/m  Physical Exam Vitals and nursing note reviewed.  Constitutional:      General: He is not in acute distress.    Appearance: He is well-developed.  HENT:     Head: Normocephalic and atraumatic.     Mouth/Throat:     Comments: Multifocal advanced dental caries.  Multiple fractured teeth.  No visible abscess. Eyes:     Conjunctiva/sclera: Conjunctivae normal.  Cardiovascular:     Rate  and Rhythm: Normal rate and regular rhythm.     Heart sounds: No murmur heard. Pulmonary:     Effort: Pulmonary effort is normal. No respiratory distress.     Breath sounds: Normal breath sounds.  Abdominal:     Palpations: Abdomen is soft.     Tenderness: There is no abdominal tenderness.  Musculoskeletal:        General: No swelling.     Cervical back: Neck supple.  Skin:    General: Skin is warm and dry.     Capillary Refill: Capillary refill takes less than 2 seconds.  Neurological:     Mental Status: He is alert.  Psychiatric:        Mood and Affect: Mood normal.      ED Course/ Medical Decision Making/ A&P    Procedures Procedures   Medications Ordered in ED Medications - No data to display Medical Decision Making:   Kristopher Dunn is a 43 y.o. male who presented to the ED today with dental pain detailed above.    Complete initial physical exam performed, notably the patient was HDS in no acute distress. No obvious intraoral lesions or swelling. Poor dentition diffusely    Reviewed and confirmed nursing documentation for past medical history, family history, social history.    Initial Assessment:   With the patient's presentation of dental pain, most likely diagnosis is reversible vs irreversible  pulpitis. Other diagnoses were considered including (but not limited to) ludwig's angina, osteitis, dental abscess, PTA, RPA. These are considered less likely due to history of present illness and physical exam findings.   This is most consistent with an acute complicated illness  Initial Plan:  Symptomatic management with NSAIDS/Tylenol  Offerred Additional management with dental nerve block per above Due to clinical overlap with potentially reversible pulpitis, antibiotics prescribed Patient will need definitive management with dentistry. Provided low cost/local dental resource chart provided by social work.  Disposition:  I have considered need for hospitalization,  however, considering all of the above, I believe this patient is stable for discharge at this time.  Patient/family educated about specific return precautions for given chief complaint and symptoms.  Patient/family educated about follow-up with PCP and dentistry.    Patient/family expressed understanding of return precautions and need for follow-up. Patient spoken to regarding all imaging and laboratory results and appropriate follow up for these results. All education provided in verbal form with additional information in written form. Time was allowed for answering of patient questions. Patient discharged.      Clinical Impression: No diagnosis found.   Data Unavailable   Final Clinical Impression(s) / ED Diagnoses Final diagnoses:  None    Rx / DC Orders ED Discharge Orders     None         Jerral Meth, MD 07/03/24 [phone]

## 2024-07-03 NOTE — ED Notes (Signed)
This RN reviewed discharge instructions with patient. he verbalized understanding and denied any further questions. PT well appearing upon discharge and reports tolerable pain. Pt ambulated with stable gait to exit. Pt endorses ride home.

## 2024-07-03 NOTE — ED Triage Notes (Signed)
 The pt is c/o dental pain for one month

## 2024-08-08 NOTE — Telephone Encounter (Signed)
 Attempted to call pt to discuss BP med. Uanble to leave VM

## 2024-08-09 NOTE — Telephone Encounter (Signed)
 This CMA called the patient back and scheduled him for a follow up appointment on 09/07/24 at 9:40 am

## 2024-08-09 NOTE — Telephone Encounter (Signed)
 This has been address in the previous telephone encounter.

## 2024-08-09 NOTE — Telephone Encounter (Signed)
 Patient is returning a missed call. Please call back at [phone]

## 2024-08-10 ENCOUNTER — Emergency Department (HOSPITAL_COMMUNITY)
Admission: EM | Admit: 2024-08-10 | Discharge: 2024-08-10 | Discharge status: Against Medical Advice | Attending: Emergency Medicine | Admitting: Emergency Medicine

## 2024-08-10 ENCOUNTER — Encounter (HOSPITAL_COMMUNITY): Payer: Self-pay

## 2024-08-10 ENCOUNTER — Emergency Department (HOSPITAL_COMMUNITY)

## 2024-08-10 ENCOUNTER — Other Ambulatory Visit: Payer: Self-pay

## 2024-08-10 DIAGNOSIS — H53143 Visual discomfort, bilateral: Secondary | ICD-10-CM | POA: Diagnosis not present

## 2024-08-10 DIAGNOSIS — R519 Headache, unspecified: Secondary | ICD-10-CM | POA: Insufficient documentation

## 2024-08-10 DIAGNOSIS — H9312 Tinnitus, left ear: Secondary | ICD-10-CM | POA: Insufficient documentation

## 2024-08-10 DIAGNOSIS — R42 Dizziness and giddiness: Secondary | ICD-10-CM | POA: Insufficient documentation

## 2024-08-10 DIAGNOSIS — R110 Nausea: Secondary | ICD-10-CM | POA: Diagnosis not present

## 2024-08-10 DIAGNOSIS — R55 Syncope and collapse: Secondary | ICD-10-CM | POA: Diagnosis not present

## 2024-08-10 DIAGNOSIS — Z5321 Procedure and treatment not carried out due to patient leaving prior to being seen by health care provider: Secondary | ICD-10-CM | POA: Insufficient documentation

## 2024-08-10 LAB — COMPREHENSIVE METABOLIC PANEL WITH GFR
ALT: 47 U/L — ABNORMAL HIGH (ref 0–44)
AST: 30 U/L (ref 15–41)
Albumin: 4 g/dL (ref 3.5–5.0)
Alkaline Phosphatase: 65 U/L (ref 38–126)
Anion gap: 9 (ref 5–15)
BUN: 15 mg/dL (ref 6–20)
CO2: 24 mmol/L (ref 22–32)
Calcium: 9.6 mg/dL (ref 8.9–10.3)
Chloride: 103 mmol/L (ref 98–111)
Creatinine, Ser: 1.03 mg/dL (ref 0.61–1.24)
GFR, Estimated: >60 mL/min (ref >60)
Glucose, Bld: 128 mg/dL — ABNORMAL HIGH (ref 70–99)
Potassium: 4.1 mmol/L (ref 3.5–5.1)
Sodium: 136 mmol/L (ref 135–145)
Total Bilirubin: 0.5 mg/dL (ref 0.0–1.2)
Total Protein: 7.8 g/dL (ref 6.5–8.1)

## 2024-08-10 LAB — CBC
HCT: 42.8 % (ref 39.0–52.0)
Hemoglobin: 13.9 g/dL (ref 13.0–17.0)
MCH: 30.3 pg (ref 26.0–34.0)
MCHC: 32.5 g/dL (ref 30.0–36.0)
MCV: 93.4 fL (ref 80.0–100.0)
Platelets: 203 K/uL (ref 150–400)
RBC: 4.58 MIL/uL (ref 4.22–5.81)
RDW: 12.8 % (ref 11.5–15.5)
WBC: 3.5 K/uL — ABNORMAL LOW (ref 4.0–10.5)
nRBC: 0 % (ref 0.0–0.2)

## 2024-08-10 LAB — CBG MONITORING, ED: Glucose-Capillary: 132 mg/dL — ABNORMAL HIGH (ref 70–99)

## 2024-08-10 NOTE — ED Provider Triage Note (Addendum)
 Emergency Medicine Provider Triage Evaluation Note  Kristopher Dunn , a 43 y.o. male  was evaluated in triage.  Pt complains of vertigo, blurry vision, headache over frontal every day, all day since Jan, seen by ophthalmology, ENT, Cardiology, PT without relief. Worse headache, blurry vision, and near syncope starting yesterday, and is why he presents to the ED today.   Scheduled for MRI tomorrow. Needs to be put to sleep for it due to anxiety.   Endorses nausea, L ear tinnitus, photophobia bilaterally Denies fever, otalgia, hearing changes, dysphagia, odynophagia, chest pain, shortness of breath, abdominal pain, vomiting, diarrhea, dysuria, LE swelling  Review of Systems  Positive: N/a Negative: N/a  Physical Exam  BP (!) 137/99   Pulse 86   Temp 98.2 F (36.8 C) (Oral)   Resp 18   Ht 5' 11 (1.803 m)   Wt (!) 162.4 kg   SpO2 98%   BMI 49.93 kg/m  Gen:   Awake, no distress   Resp:  Normal effort  MSK:   Moves extremities without difficulty  Other:    Medical Decision Making  Medically screening exam initiated at 1:05 PM.  Appropriate orders placed.  Kristopher Dunn was informed that the remainder of the evaluation will be completed by another provider, this initial triage assessment does not replace that evaluation, and the importance of remaining in the ED until their evaluation is complete.     Beola Terrall RAMAN, PA-C 08/10/24 1309    Beola Terrall Bradley, [place] 08/10/24 1309

## 2024-08-10 NOTE — ED Triage Notes (Signed)
 Dizziness, vision feels weird since January. Pt states he has been to every doctor and eye doctor to try to get diagnosis and they have found nothing. C/o forehead pain.

## 2024-08-25 ENCOUNTER — Emergency Department (HOSPITAL_COMMUNITY)
Admission: EM | Admit: 2024-08-25 | Discharge: 2024-08-25 | Disposition: A | Discharge status: Home / Self Care | Attending: Emergency Medicine | Admitting: Emergency Medicine

## 2024-08-25 ENCOUNTER — Encounter (HOSPITAL_COMMUNITY): Payer: Self-pay

## 2024-08-25 ENCOUNTER — Other Ambulatory Visit: Payer: Self-pay

## 2024-08-25 ENCOUNTER — Emergency Department (HOSPITAL_COMMUNITY)

## 2024-08-25 DIAGNOSIS — T502X5A Adverse effect of carbonic-anhydrase inhibitors, benzothiadiazides and other diuretics, initial encounter: Secondary | ICD-10-CM | POA: Diagnosis not present

## 2024-08-25 DIAGNOSIS — Z91010 Allergy to peanuts: Secondary | ICD-10-CM | POA: Diagnosis not present

## 2024-08-25 DIAGNOSIS — R200 Anesthesia of skin: Secondary | ICD-10-CM | POA: Insufficient documentation

## 2024-08-25 DIAGNOSIS — Z7982 Long term (current) use of aspirin: Secondary | ICD-10-CM | POA: Insufficient documentation

## 2024-08-25 DIAGNOSIS — I10 Essential (primary) hypertension: Secondary | ICD-10-CM | POA: Diagnosis not present

## 2024-08-25 DIAGNOSIS — Z79899 Other long term (current) drug therapy: Secondary | ICD-10-CM | POA: Insufficient documentation

## 2024-08-25 DIAGNOSIS — T50905A Adverse effect of unspecified drugs, medicaments and biological substances, initial encounter: Secondary | ICD-10-CM

## 2024-08-25 LAB — I-STAT CHEM 8, ED
BUN: 13 mg/dL (ref 6–20)
Calcium, Ion: 1.12 mmol/L — ABNORMAL LOW (ref 1.15–1.40)
Chloride: 106 mmol/L (ref 98–111)
Creatinine, Ser: 1 mg/dL (ref 0.61–1.24)
Glucose, Bld: 108 mg/dL — ABNORMAL HIGH (ref 70–99)
HCT: 41 % (ref 39.0–52.0)
Hemoglobin: 13.9 g/dL (ref 13.0–17.0)
Potassium: 5.8 mmol/L — ABNORMAL HIGH (ref 3.5–5.1)
Sodium: 136 mmol/L (ref 135–145)
TCO2: 22 mmol/L (ref 22–32)

## 2024-08-25 LAB — CBC WITH DIFFERENTIAL/PLATELET
Abs Immature Granulocytes: 0.01 K/uL (ref 0.00–0.07)
Basophils Absolute: 0 K/uL (ref 0.0–0.1)
Basophils Relative: 1 %
Eosinophils Absolute: 0.1 K/uL (ref 0.0–0.5)
Eosinophils Relative: 2 %
HCT: 43.5 % (ref 39.0–52.0)
Hemoglobin: 14.7 g/dL (ref 13.0–17.0)
Immature Granulocytes: 0 %
Lymphocytes Relative: 33 %
Lymphs Abs: 1.4 K/uL (ref 0.7–4.0)
MCH: 31.7 pg (ref 26.0–34.0)
MCHC: 33.8 g/dL (ref 30.0–36.0)
MCV: 93.8 fL (ref 80.0–100.0)
Monocytes Absolute: 0.4 K/uL (ref 0.1–1.0)
Monocytes Relative: 9 %
Neutro Abs: 2.4 K/uL (ref 1.7–7.7)
Neutrophils Relative %: 55 %
Platelets: 211 K/uL (ref 150–400)
RBC: 4.64 MIL/uL (ref 4.22–5.81)
RDW: 12.8 % (ref 11.5–15.5)
WBC: 4.4 K/uL (ref 4.0–10.5)
nRBC: 0 % (ref 0.0–0.2)

## 2024-08-25 MED ORDER — SODIUM CHLORIDE 0.9 % IV BOLUS
1000.0000 mL | Freq: Once | INTRAVENOUS | Status: AC
  Administered 2024-08-25: 1000 mL via INTRAVENOUS

## 2024-08-25 NOTE — ED Provider Triage Note (Signed)
 Emergency Medicine Provider Triage Evaluation Note  Kristopher Dunn , a 43 y.o. male  was evaluated in triage.  Pt complains of hoarseness, difficulty swallowing, shortness of breath and nausea since taking his Duda max dose 2 hours ago.  He had a lumbar puncture done yesterday.  No rash or drooling..  Review of Systems  Positive: As above Negative: As above  Physical Exam  BP 124/87 (BP Location: Right Arm)   Pulse 81   Temp 97.8 F (36.6 C)   Resp 16   Ht 5' 11 (1.803 m)   Wt (!) 162.4 kg   SpO2 99%   BMI 49.93 kg/m  Gen:   Awake, no distress   Resp:  Normal effort  MSK:   Moves extremities without difficulty  Other:    Medical Decision Making  Medically screening exam initiated at 6:31 PM.  Appropriate orders placed.  Kristopher Dunn was informed that the remainder of the evaluation will be completed by another provider, this initial triage assessment does not replace that evaluation, and the importance of remaining in the ED until their evaluation is complete.  Patient to be roomed soon as possible He does have an EpiPen with him but has not used it yet   Hildegard Loge, [place] 08/25/24 1834
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
## 2024-08-25 NOTE — ED Notes (Signed)
Pt ambulated to restroom with no difficulties

## 2024-08-25 NOTE — ED Triage Notes (Signed)
 Patient just started diamox for his IIH and first took it 2 hours ago and patient complains of tingling in his throat and tongue and a burning sensation in his lungs.  Patient keeps trying to clear his throat.

## 2024-08-25 NOTE — ED Triage Notes (Addendum)
 Pt here from home with c/o allergic reaction to a med ( DIAMOX ) that he just started today feeling sob , nad in triage

## 2024-08-25 NOTE — ED Notes (Signed)
 Per main lab, Mag and CMP keeps clotting. Blood recollected for 3rd time and sent to main lab.

## 2024-08-25 NOTE — Discharge Instructions (Addendum)
 Thank you for letting us  evaluate you today.  We have monitored you 4 hours postingestion.  Your lab work shows very mildly elevated potassium.  We have given you fluids for this.  Please follow-up next week with PCP to have labs redrawn to ensure no elevated potassium.  Please follow-up with neurosurgery for medication management  Please return to Emergency Department if you experience shortness of breath, chest pain, throat closing sensation, rash all over your body, worsening symptoms

## 2024-09-15 ENCOUNTER — Other Ambulatory Visit: Payer: Self-pay

## 2024-09-15 ENCOUNTER — Emergency Department (HOSPITAL_BASED_OUTPATIENT_CLINIC_OR_DEPARTMENT_OTHER)

## 2024-09-15 ENCOUNTER — Emergency Department (HOSPITAL_BASED_OUTPATIENT_CLINIC_OR_DEPARTMENT_OTHER)
Admission: EM | Admit: 2024-09-15 | Discharge: 2024-09-15 | Disposition: A | Discharge status: Home / Self Care | Attending: Emergency Medicine | Admitting: Emergency Medicine

## 2024-09-15 DIAGNOSIS — R0602 Shortness of breath: Secondary | ICD-10-CM | POA: Insufficient documentation

## 2024-09-15 DIAGNOSIS — M791 Myalgia, unspecified site: Secondary | ICD-10-CM | POA: Diagnosis not present

## 2024-09-15 DIAGNOSIS — Z79899 Other long term (current) drug therapy: Secondary | ICD-10-CM | POA: Diagnosis not present

## 2024-09-15 DIAGNOSIS — I10 Essential (primary) hypertension: Secondary | ICD-10-CM | POA: Diagnosis not present

## 2024-09-15 DIAGNOSIS — R1013 Epigastric pain: Secondary | ICD-10-CM | POA: Diagnosis not present

## 2024-09-15 DIAGNOSIS — J45909 Unspecified asthma, uncomplicated: Secondary | ICD-10-CM | POA: Insufficient documentation

## 2024-09-15 DIAGNOSIS — R072 Precordial pain: Secondary | ICD-10-CM | POA: Diagnosis not present

## 2024-09-15 DIAGNOSIS — Z7982 Long term (current) use of aspirin: Secondary | ICD-10-CM | POA: Insufficient documentation

## 2024-09-15 DIAGNOSIS — R079 Chest pain, unspecified: Secondary | ICD-10-CM | POA: Diagnosis present

## 2024-09-15 DIAGNOSIS — Z91010 Allergy to peanuts: Secondary | ICD-10-CM | POA: Insufficient documentation

## 2024-09-15 LAB — URINALYSIS, ROUTINE W REFLEX MICROSCOPIC
Bacteria, UA: NONE SEEN
Bilirubin Urine: NEGATIVE
Glucose, UA: NEGATIVE mg/dL
Hgb urine dipstick: NEGATIVE
Ketones, ur: NEGATIVE mg/dL
Leukocytes,Ua: NEGATIVE
Nitrite: NEGATIVE
Protein, ur: NEGATIVE mg/dL
Specific Gravity, Urine: 1.017 (ref 1.005–1.030)
pH: 6 (ref 5.0–8.0)

## 2024-09-15 LAB — TROPONIN T, HIGH SENSITIVITY
Troponin T High Sensitivity: <15 ng/L (ref 0–19)
Troponin T High Sensitivity: <15 ng/L (ref 0–19)

## 2024-09-15 LAB — BASIC METABOLIC PANEL WITH GFR
Anion gap: 12 (ref 5–15)
BUN: 10 mg/dL (ref 6–20)
CO2: 26 mmol/L (ref 22–32)
Calcium: 10.2 mg/dL (ref 8.9–10.3)
Chloride: 102 mmol/L (ref 98–111)
Creatinine, Ser: 1 mg/dL (ref 0.61–1.24)
GFR, Estimated: >60 mL/min (ref >60)
Glucose, Bld: 117 mg/dL — ABNORMAL HIGH (ref 70–99)
Potassium: 4.2 mmol/L (ref 3.5–5.1)
Sodium: 141 mmol/L (ref 135–145)

## 2024-09-15 LAB — CBC WITH DIFFERENTIAL/PLATELET
Abs Immature Granulocytes: 0.01 K/uL (ref 0.00–0.07)
Basophils Absolute: 0 K/uL (ref 0.0–0.1)
Basophils Relative: 1 %
Eosinophils Absolute: 0.1 K/uL (ref 0.0–0.5)
Eosinophils Relative: 2 %
HCT: 42.9 % (ref 39.0–52.0)
Hemoglobin: 14.3 g/dL (ref 13.0–17.0)
Immature Granulocytes: 0 %
Lymphocytes Relative: 31 %
Lymphs Abs: 1 K/uL (ref 0.7–4.0)
MCH: 31 pg (ref 26.0–34.0)
MCHC: 33.3 g/dL (ref 30.0–36.0)
MCV: 93.1 fL (ref 80.0–100.0)
Monocytes Absolute: 0.4 K/uL (ref 0.1–1.0)
Monocytes Relative: 12 %
Neutro Abs: 1.7 K/uL (ref 1.7–7.7)
Neutrophils Relative %: 54 %
Platelets: 192 K/uL (ref 150–400)
RBC: 4.61 MIL/uL (ref 4.22–5.81)
RDW: 12.7 % (ref 11.5–15.5)
WBC: 3.2 K/uL — ABNORMAL LOW (ref 4.0–10.5)
nRBC: 0 % (ref 0.0–0.2)

## 2024-09-15 MED ORDER — FAMOTIDINE 20 MG PO TABS
20.0000 mg | ORAL_TABLET | Freq: Once | ORAL | Status: AC
  Administered 2024-09-15: 20 mg via ORAL
  Filled 2024-09-15: qty 1

## 2024-09-15 MED ORDER — ACETAMINOPHEN 500 MG PO TABS
1000.0000 mg | ORAL_TABLET | Freq: Once | ORAL | Status: AC
  Administered 2024-09-15: 1000 mg via ORAL
  Filled 2024-09-15: qty 2

## 2024-09-15 NOTE — Discharge Instructions (Signed)
 EKG and lab work and exam were reassuring today.  It is advised to monitor for worsening symptoms including worsening chest pain, shortness of breath, sweating, dizziness, fainting, weakness.  If these or other concerning symptoms occur return to ED for further evaluation.  I placed a cardiology referral it is recommended to follow-up with them for further recommendation and management.

## 2024-09-15 NOTE — ED Triage Notes (Signed)
 Patient states shortness of breath and chest pain since this morning. States hx of asthma and using inhaler without relief.

## 2024-09-15 NOTE — ED Notes (Signed)
 X-ray at bedside.

## 2024-09-15 NOTE — ED Notes (Signed)
 Reviewed AVS/discharge instructions with patient. Time allotted for and all questions answered. Patient is agreeable for d/c and escorted to ED exit by staff.
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
## 2024-10-24 ENCOUNTER — Other Ambulatory Visit: Payer: Self-pay | Admitting: Medical Genetics

## 2024-10-24 DIAGNOSIS — Z006 Encounter for examination for normal comparison and control in clinical research program: Secondary | ICD-10-CM

## 2024-10-28 ENCOUNTER — Emergency Department (HOSPITAL_BASED_OUTPATIENT_CLINIC_OR_DEPARTMENT_OTHER)
Admission: EM | Admit: 2024-10-28 | Discharge: 2024-10-28 | Disposition: A | Discharge status: Home / Self Care | Attending: Emergency Medicine | Admitting: Emergency Medicine

## 2024-10-28 ENCOUNTER — Emergency Department (HOSPITAL_BASED_OUTPATIENT_CLINIC_OR_DEPARTMENT_OTHER): Admitting: Radiology

## 2024-10-28 ENCOUNTER — Encounter (HOSPITAL_BASED_OUTPATIENT_CLINIC_OR_DEPARTMENT_OTHER): Payer: Self-pay

## 2024-10-28 ENCOUNTER — Other Ambulatory Visit: Payer: Self-pay

## 2024-10-28 DIAGNOSIS — I2510 Atherosclerotic heart disease of native coronary artery without angina pectoris: Secondary | ICD-10-CM | POA: Diagnosis not present

## 2024-10-28 DIAGNOSIS — I10 Essential (primary) hypertension: Secondary | ICD-10-CM | POA: Insufficient documentation

## 2024-10-28 DIAGNOSIS — R0789 Other chest pain: Secondary | ICD-10-CM | POA: Insufficient documentation

## 2024-10-28 DIAGNOSIS — Z7982 Long term (current) use of aspirin: Secondary | ICD-10-CM | POA: Diagnosis not present

## 2024-10-28 LAB — CBC
HCT: 41.5 % (ref 39.0–52.0)
Hemoglobin: 14.1 g/dL (ref 13.0–17.0)
MCH: 31.1 pg (ref 26.0–34.0)
MCHC: 34 g/dL (ref 30.0–36.0)
MCV: 91.4 fL (ref 80.0–100.0)
Platelets: 201 K/uL (ref 150–400)
RBC: 4.54 MIL/uL (ref 4.22–5.81)
RDW: 12.7 % (ref 11.5–15.5)
WBC: 4.4 K/uL (ref 4.0–10.5)
nRBC: 0 % (ref 0.0–0.2)

## 2024-10-28 LAB — BASIC METABOLIC PANEL WITH GFR
Anion gap: 12 (ref 5–15)
BUN: 13 mg/dL (ref 6–20)
CO2: 26 mmol/L (ref 22–32)
Calcium: 9.3 mg/dL (ref 8.9–10.3)
Chloride: 101 mmol/L (ref 98–111)
Creatinine, Ser: 1.05 mg/dL (ref 0.61–1.24)
GFR, Estimated: >60 mL/min (ref >60)
Glucose, Bld: 103 mg/dL — ABNORMAL HIGH (ref 70–99)
Potassium: 3.9 mmol/L (ref 3.5–5.1)
Sodium: 139 mmol/L (ref 135–145)

## 2024-10-28 LAB — D-DIMER, QUANTITATIVE: D-Dimer, Quant: <0.27 ug{FEU}/mL (ref 0.00–0.50)

## 2024-10-28 LAB — TROPONIN T, HIGH SENSITIVITY: Troponin T High Sensitivity: <15 ng/L (ref 0–19)

## 2024-10-28 MED ORDER — LIDOCAINE VISCOUS HCL 2 % MT SOLN
15.0000 mL | Freq: Once | OROMUCOSAL | Status: AC
  Administered 2024-10-28: 15 mL via ORAL
  Filled 2024-10-28: qty 15

## 2024-10-28 MED ORDER — ACETAMINOPHEN 500 MG PO TABS
1000.0000 mg | ORAL_TABLET | Freq: Once | ORAL | Status: AC
  Administered 2024-10-28: 1000 mg via ORAL
  Filled 2024-10-28: qty 2

## 2024-10-28 MED ORDER — ALUM & MAG HYDROXIDE-SIMETH 200-200-20 MG/5ML PO SUSP
30.0000 mL | Freq: Once | ORAL | Status: AC
  Administered 2024-10-28: 30 mL via ORAL
  Filled 2024-10-28: qty 30

## 2024-10-28 MED ORDER — PANTOPRAZOLE SODIUM 40 MG PO TBEC: 40.0000 mg | DELAYED_RELEASE_TABLET | Freq: Every day | ORAL | 0 refills | Status: AC
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
## 2024-10-28 NOTE — Discharge Instructions (Signed)
 Your workup today is reassuring. It is very unlikely that your pain is due to an issue in your heart.  Your cardiac enzyme (troponin) was normal today. Your EKG which is a measure of the heart's electrical activity and rhythm is normal today. These would both show abnormalities if you were having a heart attack.  Your chest x-ray is normal today.  Your blood test to look for signs of a blood clot was normal.  You have been prescribed a medication called Protonix  (pantoprazole ) to help with acid reflux.  Take this medication once daily as prescribed for the next 30 days.  Please follow-up with your PCP and cardiologist within the next week for recheck of your symptoms.  Return to the ER if you have any shortness of breath, difficulty breathing, worsening chest pain, dizziness, jaw pain, left arm or shoulder pain, abdominal pain, unexplained fever, any other new or concerning symptoms.

## 2024-10-28 NOTE — ED Triage Notes (Signed)
 Pt c/o CP onset yesterday, started sweating real bad & almost passed out yesterday. Advises that CP went away last night, came back today, had a dull/ weird pain, like stabbing, I almost passed out again. Advises SHOB when stabbing pain happens, like it takes my breath.

## 2024-11-18 ENCOUNTER — Encounter (HOSPITAL_BASED_OUTPATIENT_CLINIC_OR_DEPARTMENT_OTHER): Payer: Self-pay | Admitting: Emergency Medicine

## 2024-11-18 ENCOUNTER — Other Ambulatory Visit: Payer: Self-pay

## 2024-11-18 ENCOUNTER — Emergency Department (HOSPITAL_BASED_OUTPATIENT_CLINIC_OR_DEPARTMENT_OTHER)
Admission: EM | Admit: 2024-11-18 | Discharge: 2024-11-18 | Disposition: A | Discharge status: Home / Self Care | Attending: Emergency Medicine | Admitting: Emergency Medicine

## 2024-11-18 DIAGNOSIS — Z91010 Allergy to peanuts: Secondary | ICD-10-CM | POA: Insufficient documentation

## 2024-11-18 DIAGNOSIS — J069 Acute upper respiratory infection, unspecified: Secondary | ICD-10-CM | POA: Diagnosis not present

## 2024-11-18 DIAGNOSIS — Z79899 Other long term (current) drug therapy: Secondary | ICD-10-CM | POA: Insufficient documentation

## 2024-11-18 DIAGNOSIS — Z7982 Long term (current) use of aspirin: Secondary | ICD-10-CM | POA: Diagnosis not present

## 2024-11-18 DIAGNOSIS — I10 Essential (primary) hypertension: Secondary | ICD-10-CM | POA: Insufficient documentation

## 2024-11-18 DIAGNOSIS — R059 Cough, unspecified: Secondary | ICD-10-CM | POA: Diagnosis present

## 2024-11-18 LAB — RESP PANEL BY RT-PCR (RSV, FLU A&B, COVID)  RVPGX2
Influenza A by PCR: NEGATIVE
Influenza B by PCR: NEGATIVE
Resp Syncytial Virus by PCR: NEGATIVE
SARS Coronavirus 2 by RT PCR: NEGATIVE

## 2024-11-18 MED ORDER — ALUM & MAG HYDROXIDE-SIMETH 200-200-20 MG/5ML PO SUSP
30.0000 mL | Freq: Once | ORAL | Status: AC
  Administered 2024-11-18: 30 mL via ORAL
  Filled 2024-11-18: qty 30

## 2024-11-18 MED ORDER — DEXAMETHASONE SOD PHOSPHATE PF 10 MG/ML IJ SOLN
10.0000 mg | Freq: Once | INTRAMUSCULAR | Status: AC
  Administered 2024-11-18: 10 mg via INTRAMUSCULAR

## 2024-11-18 MED ORDER — BENZONATATE 100 MG PO CAPS: 100.0000 mg | ORAL_CAPSULE | Freq: Three times a day (TID) | ORAL | 0 refills | Status: AC

## 2024-11-18 MED ORDER — LIDOCAINE VISCOUS HCL 2 % MT SOLN
15.0000 mL | Freq: Once | OROMUCOSAL | Status: AC
  Administered 2024-11-18: 15 mL via ORAL
  Filled 2024-11-18: qty 15

## 2024-11-18 NOTE — ED Notes (Signed)
 BBS clear; no wheezing noted.

## 2024-11-18 NOTE — Discharge Instructions (Signed)
 It was a pleasure taking care of you today. You were seen in the Emergency Department for evaluation of upper respiratory tract infection symptoms. Your work-up was reassuring. Your respiratory panel was negative for the flu, COVID, RSV.  I suspect your symptoms are likely secondary to an unspecified URI such as the common cold.  I have sent you a prescription for Tessalon  Perles to help with your cough.  You may take 1 capsule every 8 hours as needed for management.  You can also continue taking DayQuil every 6 hours as needed.  Please do not take additional Tylenol  while taking the DayQuil as DayQuil does contain Tylenol . Refer to the attached documentation for further management of your symptoms. Follow up with your PCP if your symptoms worsen.  Please return to the ER if you experience chest pain, trouble breathing, intractable nausea/vomiting or any other life threatening illnesses.

## 2024-11-18 NOTE — ED Triage Notes (Signed)
 Pt via pov from home with flu-like symptoms: sore throat, cough, congestion x 3 days. No fever today. Pt a&o x 4; nad noted.

## 2024-11-18 NOTE — ED Provider Notes (Signed)
 [place] EMERGENCY DEPARTMENT AT [facility] Provider Note   CSN: [id number] Arrival date & time: 11/18/24  1137     Patient presents with: Sore Throat and Cough   Kristopher Dunn is a 43 y.o. male with past medical history of IIH, hypertension, morbid obesity who presents emergency department for evaluation of cough and sore throat.  Patient reports his symptoms began approximately 3 days ago.  He reports a sore throat, headache, congestion, nonproductive cough.  He states this headache is primarily in his forehead, which is different than his headaches for IIH.  He is unsure if he has had a fever because he has been taking DayQuil.  However, he does report body aches.  He denies any chest pain or shortness of breath.  He does report concern about possibly passing his illness to his family members including his mother, wife and children.  Patient most recently took DayQuil approximately 2 hours ago prior to arrival.    Sore Throat  Cough Associated symptoms: sore throat        Prior to Admission medications   Medication Sig Start Date End Date Taking? Authorizing Provider  benzonatate  (TESSALON ) 100 MG capsule Take 1 capsule (100 mg total) by mouth every 8 (eight) hours. 11/18/24  Yes Gerado Nabers, Marry RAMAN, PA-C  albuterol  (VENTOLIN  HFA) 108 (90 Base) MCG/ACT inhaler Inhale 1-2 puffs into the lungs every 6 (six) hours as needed for wheezing or shortness of breath. 04/07/23   Roemhildt, Lorin T, PA-C  amLODipine  (NORVASC ) 5 MG tablet Take 1 tablet (5 mg total) by mouth daily. 03/12/22   Newlin, Enobong, MD  amoxicillin -clavulanate (AUGMENTIN ) 875-125 MG tablet Take 1 tablet by mouth every 12 (twelve) hours. 06/12/24   Zackowski, Scott, MD  aspirin  EC 81 MG tablet Take 81 mg by mouth daily. Swallow whole.    [provider]  furosemide (LASIX) 20 MG tablet Take 10 mg by mouth. 08/26/24 08/26/25  [provider]  gabapentin (NEURONTIN) 100 MG capsule Take 100 mg by  mouth at bedtime. 02/21/21   [provider]  meclizine  (ANTIVERT ) 25 MG tablet Take 1 tablet (25 mg total) by mouth 3 (three) times daily as needed for dizziness. 01/28/24   Tegeler, Lonni PARAS, MD  Omega-3 Fatty Acids (OMEGA 3 PO) Take 1 capsule by mouth every morning.    [provider]  ondansetron  (ZOFRAN -ODT) 4 MG disintegrating tablet Take 1 tablet (4 mg total) by mouth every 8 (eight) hours as needed for nausea or vomiting. 10/01/23   Raford Lenis, MD  oxyCODONE  (ROXICODONE ) 15 MG immediate release tablet Take 1 tablet by mouth 5 (five) times daily as needed (pain).    [provider]  pantoprazole  (PROTONIX ) 40 MG tablet Take 1 tablet (40 mg total) by mouth daily. 10/28/24 11/27/24  Veta Palma, PA-C  Semaglutide -Weight Management (WEGOVY ) 0.25 MG/0.5ML SOAJ Inject 0.25 milligram under skin weekly 09/30/23     Semaglutide -Weight Management 0.25 MG/0.5ML SOAJ Inject 0.25 mg into the skin once a week.    [provider]  Vitamin D, Ergocalciferol, (DRISDOL) 1.25 MG (50000 UNIT) CAPS capsule Take 50,000 Units by mouth every Monday. 02/21/21   [provider]    Allergies: Peanut-containing drug products, Peanuts [peanut oil], and Shellfish allergy    Review of Systems  HENT:  Positive for sore throat.   Respiratory:  Positive for cough.     Updated Vital Signs BP 122/86 (BP Location: Right Arm)   Pulse 81   Temp 98 F (  36.7 C) (Oral)   Resp 17   Ht 6' (1.829 m)   Wt (!) 166.9 kg   SpO2 99%   BMI 49.91 kg/m   Physical Exam Vitals and nursing note reviewed.  Constitutional:      Appearance: Normal appearance.  HENT:     Head: Normocephalic and atraumatic.     Mouth/Throat:     Mouth: Mucous membranes are moist.     Pharynx: Posterior oropharyngeal erythema present.     Comments: Mild pharynx erythema.  No obvious exudate.  Uvula is midline. Eyes:     General: No scleral icterus.       Right eye: No discharge.        Left eye:  No discharge.     Conjunctiva/sclera: Conjunctivae normal.  Cardiovascular:     Rate and Rhythm: Normal rate and regular rhythm.     Pulses: Normal pulses.  Pulmonary:     Effort: Pulmonary effort is normal.     Breath sounds: Normal breath sounds.  Abdominal:     General: There is no distension.     Tenderness: There is no abdominal tenderness.  Musculoskeletal:        General: No deformity.     Cervical back: Normal range of motion.  Skin:    General: Skin is warm and dry.     Capillary Refill: Capillary refill takes less than 2 seconds.  Neurological:     Mental Status: He is alert.     Motor: No weakness.  Psychiatric:        Mood and Affect: Mood normal.     (all labs ordered are listed, but only abnormal results are displayed) Labs Reviewed  RESP PANEL BY RT-PCR (RSV, FLU A&B, COVID)  [id number]    EKG: None  Radiology: No results found.   Procedures   Medications Ordered in the ED  dexamethasone  (DECADRON ) injection 10 mg (10 mg Intramuscular Given 11/18/24 1223)  alum & mag hydroxide-simeth (MAALOX/MYLANTA) 200-200-20 MG/5ML suspension 30 mL (30 mLs Oral Given 11/18/24 1223)    And  lidocaine  (XYLOCAINE ) 2 % viscous mouth solution 15 mL (15 mLs Oral Given 11/18/24 1223)                                   Medical Decision Making Risk OTC drugs. Prescription drug management.   This patient presents to the ED for concern of URI symptoms, this involves an extensive number of treatment options, and is a complaint that carries with it a high risk of complications and morbidity.   Differential diagnosis includes: Flu, COVID, RSV, unspecified URI, strep, tonsillitis, PTA  Co morbidities:  hypertension, IIH  Lab Tests:  I Ordered, and personally interpreted labs.  The pertinent results include: Respiratory panel negative  Imaging Studies:  Not indicated  Cardiac Monitoring/ECG:  The patient was maintained on a cardiac monitor.  I personally viewed and  interpreted the cardiac monitored which showed an underlying rhythm of: Sinus rhythm  Medicines ordered and prescription drug management:  I ordered medication including  Medications  dexamethasone  (DECADRON ) injection 10 mg (10 mg Intramuscular Given 11/18/24 1223)  alum & mag hydroxide-simeth (MAALOX/MYLANTA) 200-200-20 MG/5ML suspension 30 mL (30 mLs Oral Given 11/18/24 1223)    And  lidocaine  (XYLOCAINE ) 2 % viscous mouth solution 15 mL (15 mLs Oral Given 11/18/24 1223)   for sore throat Reevaluation of the patient after these medicines showed that  the patient improved I have reviewed the patients home medicines and have made adjustments as needed  Test Considered:   none  Critical Interventions:   none  Consultations Obtained: None  Problem List / ED Course:     ICD-10-CM   1. Upper respiratory tract infection, unspecified type  J06.9       MDM: 43 year old male who presents emergency department for evaluation of URI symptoms.  Patient began experiencing a sore throat, congestion, cough, headache approximately 3 days ago.  Patient has been taking DayQuil at home for symptomatic management.  However, he reports emergency department today because he has concern for getting his family who are sick.  On physical exam, there is mild erythema to the posterior pharynx.  No obvious exudate noted.  Uvula is midline.  Breath sounds are clear bilaterally.  Patient is not in any apparent distress.  Respiratory panel is negative for the flu, COVID, RSV.  I did order a shot of Decadron  and viscous lidocaine  for patient's sore throat.  Reevaluation, patient reports his sore throat has improved.  I have informed the patient of his respiratory panel results.  I have also educated patient that he may continue taking DayQuil as needed for his symptoms and that he should not take additional Tylenol .  I have sent in a prescription for Tessalon  Perles to help with his cough.  I informed the patient  that if his symptoms continue or worsen he should follow-up with his PCP for further evaluation.  Patient verbalizes understanding to this.  Patient's vital signs are stable.  Patient is appropriate for discharge at this time.   Dispostion:  After consideration of the diagnostic results and the patients response to treatment, I feel that the patient would benefit from supportive care.   Final diagnoses:  Upper respiratory tract infection, unspecified type    ED Discharge Orders          Ordered    benzonatate  (TESSALON ) 100 MG capsule  Every 8 hours        11/18/24 1245               Darelle Kings S, PA-C 11/18/24 1248    Charlyn Sora, MD 11/18/24 1520

## 2024-11-25 NOTE — Progress Notes (Signed)
 "   GASTROENTEROLOGY OUTPATIENT CONSULTATION NOTE  REFERRING PHYSICIAN: No ref. provider found  CC: Chief Complaint  Patient presents with   Consult   Colon Cancer Screening   Difficulty Swallowing   Chest Pain    HISTORY OF PRESENT ILLNESS: Kristopher Dunn Dunn is Kristopher Dunn 43 Dunn.o. male with Kristopher Dunn significant PMHx including OSA, GERD, fatty liver, HTN, obesity, PTSD who presents to the office today for further evaluation of the above. Their chart reviewed and we have never seen patient. Pt has never had colonoscopy notes prior  EGD through [facility] (pt to get records to us ) - noted to have normal findings. He notes that his mom was diagnosed with colon cancer in her 3s (doing well s/p partial colectomy).   Today, patient notes burning epigastric pain for many years, of which has increased over the past couple of years. Notes normal cardiac work-up this summer. Notes that his epigastric pain will radiate to his back and that it is worse depending on the foods that he eats, noting that in the morning he will wake up with nausea and bitter taste in his mouth. Notes that with use of pantoprazole  40 mg he had some benefit in symptoms and that he would take before bed, ran out yesterday.  The patient denies having any vomiting. Notes occasional dysphagia. Weight and appetite stable. On 81mg  AsA no NSAID use. Notes change in bowel habits favoring increasing diarrhea, noting incomplete Bms, and that he will typically have Kristopher Dunn BM in the AM. Notes Bms consistent with BSC type 6-7, and nocturnal stools every other night (present for the past year - will collect infectious stool studies). No rectal bleeding or melena. No GLP-1 yet due to insurance.    Labs: (11/03/24): CBC w/diff, CMP, A1c, lipids: reviewed and unremarkable other than hgb of 13.9   ALLERGIES: Allergies[1]  MEDICATIONS: Current Medications[2]  PAST MEDICAL HISTORY: Problem List[3]  PAST SURGICAL HISTORY: Surgical History[4]  SOCIAL  HISTORY: Tobacco Use History[5] Social History   Substance and Sexual Activity  Alcohol Use Not Currently   Alcohol/week: 30.0 standard drinks of alcohol   Types: 10 Cans of beer, 20 Shots of liquor per week   Social History   Substance and Sexual Activity  Drug Use Not Currently   Frequency: 2.0 times per week   Types: Cocaine, Marijuana, Oxycodone    Comment: stopped all drugs in 2022-takes oxy for pain herniated disc    FAMILY HISTORY: Negative for colon cancer, colon polyps, ulcerative colitis, Crohn's disease, liver disease.  REVIEW OF SYSTEMS: Kristopher Dunn complete ROS of negative except those stated in HPI.  LABS: Pertinent labs per HPI  IMAGING:  Pertinent GI imaging per HPI  VITAL SIGNS:  Height: 1.829 m (6') (11/25/2024  1:38 PM) Weight: (!) 170 kg (373 lb 12.8 oz) (11/25/2024  1:38 PM)  Body mass index is 50.7 kg/m.  Vitals:   11/25/24 1338  BP: 128/86  Pulse: 92  Temp: 96.9 F (36.1 C)  SpO2: 96%    PHYSICAL EXAM: Well developed, well nourished, obese male.  No acute distress.   Skin warm and dry.  HEENT: Normocephalic, atraumatic. No scleral icterus.  No oropharyngeal lesions, mucosa pink and moist. Neck: Normal to inspection. Supple. Lungs:Respiratory effort unlabored. Clear to auscultation.  Cardiac: Rhythm regular with no murmur Abdomen:  Active bowel sounds,soft, nondistended, nontender  Extremities without  clubbing, cyanosis, edema.  Musculoskeletal:  No gross motor deficits. Neurological: Alert and oriented x 3. Mood and affect appropriate.   ASSESSMENT:  1. Encounter for screening colonoscopy   2. Family history of colon cancer in mother   3. Diarrhea, unspecified type   4. Epigastric pain   5. Chronic GERD     Orders Placed This Encounter  Procedures   Endo EGD   Endo Colon    PLAN: 1.) EGD w/ possible dilation & Colonoscopy- will schedule today for pt at [facility] - pt instructed to complete infectious stools studies prior to  procedures. If to start GLP-1 pt instructed to hold dose x 1 week prior to procedures.  2.) Pt to start pantoprazole  40 mg BID (20-30 min before AM and PM meal) - Rx sent 3.) GERD diet and lifestyle modifications reviewed with pt. 4.) Further recs and f/u pending the above plan - to include additional labs and or imaging.    History of pacemaker or defibrillator: No Is BMI >45: Yes Does patient use O2: No Is patient on anticoagulation: No Is patient on GLP-1 receptor agonist: No  Preparation: Miralax Endoscopist: Kristopher D. Shearin, MD ASA: 3 Mallampati: 3 Anesthesia plan: Propofol at [facility]  I discussed the nature of the recommended EGD/colonoscopy , as well as the indications, risks, alternatives and potential complications including, but not limited to, bleeding, infection, reaction to medication, damage to internal organs, cardiac and/or pulmonary problems, and perforation requiring surgery (1 to 2 in 1000). The possibility that significant findings could be missed was explained. Any questions the patient had were answered. The patient gives consent for the procedure.  Thank you for allowing us  to participate in the care of this patient.       [1] Allergies Allergen Reactions   Peanut Swelling   Peanut Oil Anaphylaxis   Shellfish Containing Products   [2]  Current Outpatient Medications:    albuterol  HFA (PROVENTIL  HFA;VENTOLIN  HFA;PROAIR  HFA) 90 mcg/actuation inhaler, , Disp: , Rfl:    allopurinoL (ZYLOPRIM) 100 mg tablet, Take 1 tablet by mouth daily., Disp: , Rfl:    amLODIPine  (NORVASC ) 10 mg tablet, TAKE 1 TABLET BY MOUTH EVERY DAY, Disp: 90 tablet, Rfl: 0   atenoloL  (TENORMIN ) 25 mg tablet, Take 25 mg by mouth daily. (Patient taking differently: Take 25 mg by mouth daily. Takes 1/2 tab), Disp: , Rfl:    budesonide-formoteroL (Symbicort) 80-4.5 mcg/actuation inhaler, Inhale 2 puffs 2 (two) times Kristopher Dunn day. (Patient taking differently: Inhale 2 puffs  as needed.), Disp: 1 each, Rfl: 6   buPROPion (WELLBUTRIN XL) 150 mg 24 hr tablet, Take 150 mg by mouth in the morning., Disp: , Rfl:    ciclopirox (PENLAC) 8 % solution, Apply topically at bedtime. Apply over nail and surrounding skin. Apply daily over previous coat. After seven (7) days, may remove with alcohol and continue cycle., Disp: 6.6 mL, Rfl: 0   ergocalciferol (VITAMIN D2) 1,250 mcg (50,000 unit) capsule, Take 1 capsule (50,000 Units total) by mouth once Kristopher Dunn week., Disp: 12 capsule, Rfl: 0   furosemide (Lasix) 40 mg tablet, 2 in AM and 1 in PM, Disp: 90 tablet, Rfl: 3   oxyCODONE  (ROXICODONE ) 15 mg immediate release tablet, , Disp: , Rfl:    pantoprazole  (PROTONIX ) 40 mg EC tablet, Take 1 tablet (40 mg total) by mouth 2 (two) times Kristopher Dunn day., Disp: 180 tablet, Rfl: 3   tirzepatide, weight loss, (ZEPBOUND) 2.5 mg/0.5 mL subcutaneous pen injector, Inject 0.5 mL (2.5 mg total) under the skin every 7 days. (Patient not taking: Reported on 11/25/2024), Disp: 2 mL, Rfl: 0 [3] Patient Active Problem  List Diagnosis   Class 3 severe obesity with serious comorbidity and body mass index (BMI) of 50.0 to 59.9 in adult (CMD)   Essential hypertension   Obstructive sleep apnea   Vitamin D deficiency   History of cocaine use   Meningioma    (CMD)   Dizziness   Nonintractable episodic headache   Tinnitus of both ears   Neck pain   IIH (idiopathic intracranial hypertension)   Arthritis   Localized osteoarthritis of left knee  [4] Past Surgical History: Procedure Laterality Date   APPENDECTOMY  2014  [5] Social History Tobacco Use  Smoking Status Former   Current packs/day: 0.00   Types: Cigarettes   Quit date: 12/23/2016   Years since quitting: 7.9   Passive exposure: Past  Smokeless Tobacco Never  "

## 2024-12-25 ENCOUNTER — Other Ambulatory Visit: Payer: Self-pay

## 2024-12-25 ENCOUNTER — Encounter (HOSPITAL_COMMUNITY): Payer: Self-pay | Admitting: *Deleted

## 2024-12-25 ENCOUNTER — Emergency Department (HOSPITAL_COMMUNITY)
Admission: EM | Admit: 2024-12-25 | Discharge: 2024-12-25 | Disposition: A | Discharge status: Home / Self Care | Attending: Emergency Medicine | Admitting: Emergency Medicine

## 2024-12-25 DIAGNOSIS — Z7982 Long term (current) use of aspirin: Secondary | ICD-10-CM | POA: Insufficient documentation

## 2024-12-25 DIAGNOSIS — Z91010 Allergy to peanuts: Secondary | ICD-10-CM | POA: Insufficient documentation

## 2024-12-25 DIAGNOSIS — E1165 Type 2 diabetes mellitus with hyperglycemia: Secondary | ICD-10-CM | POA: Insufficient documentation

## 2024-12-25 DIAGNOSIS — I10 Essential (primary) hypertension: Secondary | ICD-10-CM | POA: Diagnosis not present

## 2024-12-25 DIAGNOSIS — R519 Headache, unspecified: Secondary | ICD-10-CM | POA: Insufficient documentation

## 2024-12-25 DIAGNOSIS — Z79899 Other long term (current) drug therapy: Secondary | ICD-10-CM | POA: Diagnosis not present

## 2024-12-25 LAB — CBC WITH DIFFERENTIAL/PLATELET
Abs Immature Granulocytes: 0.01 K/uL (ref 0.00–0.07)
Basophils Absolute: 0 K/uL (ref 0.0–0.1)
Basophils Relative: 1 %
Eosinophils Absolute: 0.1 K/uL (ref 0.0–0.5)
Eosinophils Relative: 2 %
HCT: 45.2 % (ref 39.0–52.0)
Hemoglobin: 14.8 g/dL (ref 13.0–17.0)
Immature Granulocytes: 0 %
Lymphocytes Relative: 38 %
Lymphs Abs: 1.6 K/uL (ref 0.7–4.0)
MCH: 31.1 pg (ref 26.0–34.0)
MCHC: 32.7 g/dL (ref 30.0–36.0)
MCV: 95 fL (ref 80.0–100.0)
Monocytes Absolute: 0.5 K/uL (ref 0.1–1.0)
Monocytes Relative: 12 %
Neutro Abs: 2.1 K/uL (ref 1.7–7.7)
Neutrophils Relative %: 47 %
Platelets: 225 K/uL (ref 150–400)
RBC: 4.76 MIL/uL (ref 4.22–5.81)
RDW: 13.4 % (ref 11.5–15.5)
WBC: 4.3 K/uL (ref 4.0–10.5)
nRBC: 0 % (ref 0.0–0.2)

## 2024-12-25 LAB — URINE DRUG SCREEN
Amphetamines: NEGATIVE
Barbiturates: NEGATIVE
Benzodiazepines: NEGATIVE
Cocaine: NEGATIVE
Fentanyl: NEGATIVE
Methadone Scn, Ur: NEGATIVE
Opiates: NEGATIVE
Tetrahydrocannabinol: NEGATIVE

## 2024-12-25 LAB — COMPREHENSIVE METABOLIC PANEL WITH GFR
ALT: 63 U/L — ABNORMAL HIGH (ref 0–44)
AST: 32 U/L (ref 15–41)
Albumin: 4.4 g/dL (ref 3.5–5.0)
Alkaline Phosphatase: 73 U/L (ref 38–126)
Anion gap: 11 (ref 5–15)
BUN: 14 mg/dL (ref 6–20)
CO2: 28 mmol/L (ref 22–32)
Calcium: 9.8 mg/dL (ref 8.9–10.3)
Chloride: 100 mmol/L (ref 98–111)
Creatinine, Ser: 1.44 mg/dL — ABNORMAL HIGH (ref 0.61–1.24)
GFR, Estimated: >60 mL/min
Glucose, Bld: 132 mg/dL — ABNORMAL HIGH (ref 70–99)
Potassium: 4.2 mmol/L (ref 3.5–5.1)
Sodium: 139 mmol/L (ref 135–145)
Total Bilirubin: 0.4 mg/dL (ref 0.0–1.2)
Total Protein: 7.9 g/dL (ref 6.5–8.1)

## 2024-12-25 LAB — ETHANOL: Alcohol, Ethyl (B): <15 mg/dL

## 2024-12-25 LAB — TROPONIN T, HIGH SENSITIVITY
Troponin T High Sensitivity: <15 ng/L (ref 0–19)
Troponin T High Sensitivity: <15 ng/L (ref 0–19)

## 2024-12-25 LAB — SALICYLATE LEVEL: Salicylate Lvl: <7 mg/dL — ABNORMAL LOW (ref 7.0–30.0)
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
## 2024-12-25 NOTE — ED Notes (Signed)
 Patient observed leaving before discharge papers were ready to print and be reviewed.
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
## 2024-12-27 ENCOUNTER — Encounter (HOSPITAL_BASED_OUTPATIENT_CLINIC_OR_DEPARTMENT_OTHER): Payer: Self-pay | Admitting: Emergency Medicine

## 2024-12-27 ENCOUNTER — Emergency Department (HOSPITAL_BASED_OUTPATIENT_CLINIC_OR_DEPARTMENT_OTHER)
Admission: EM | Admit: 2024-12-27 | Discharge: 2024-12-27 | Disposition: A | Discharge status: Home / Self Care | Attending: Emergency Medicine | Admitting: Emergency Medicine

## 2024-12-27 ENCOUNTER — Other Ambulatory Visit: Payer: Self-pay

## 2024-12-27 DIAGNOSIS — J45909 Unspecified asthma, uncomplicated: Secondary | ICD-10-CM | POA: Insufficient documentation

## 2024-12-27 DIAGNOSIS — Z7982 Long term (current) use of aspirin: Secondary | ICD-10-CM | POA: Insufficient documentation

## 2024-12-27 DIAGNOSIS — R52 Pain, unspecified: Secondary | ICD-10-CM

## 2024-12-27 DIAGNOSIS — Z794 Long term (current) use of insulin: Secondary | ICD-10-CM | POA: Diagnosis not present

## 2024-12-27 DIAGNOSIS — M791 Myalgia, unspecified site: Secondary | ICD-10-CM | POA: Insufficient documentation

## 2024-12-27 DIAGNOSIS — R0602 Shortness of breath: Secondary | ICD-10-CM | POA: Insufficient documentation

## 2024-12-27 DIAGNOSIS — Z91010 Allergy to peanuts: Secondary | ICD-10-CM | POA: Insufficient documentation

## 2024-12-27 MED ORDER — XOFLUZA (80 MG DOSE) 1 X 80 MG PO TBPK: 80.0000 mg | ORAL_TABLET | Freq: Once | ORAL | 0 refills | Status: AC

## 2024-12-27 MED ORDER — ALBUTEROL SULFATE HFA 108 (90 BASE) MCG/ACT IN AERS: 1.0000 | INHALATION_SPRAY | Freq: Four times a day (QID) | RESPIRATORY_TRACT | 0 refills | Status: AC | PRN

## 2024-12-27 NOTE — ED Provider Notes (Signed)
 " [place] EMERGENCY DEPARTMENT AT [facility] Provider Note   CSN: [id number] Arrival date & time: 12/27/24  1157     Patient presents with: Headache   Kristopher Dunn is a 44 y.o. male.   44 year old male presents to the ER with [facility], body aches, onset yesterday. History of asthma, is out of inhaler. SHOB better that when symptoms started. Denies fevers, cough, congestion. Concerned he has the flu.        Prior to Admission medications  Medication Sig Start Date End Date Taking? Authorizing Provider  albuterol  (VENTOLIN  HFA) 108 (90 Base) MCG/ACT inhaler Inhale 1-2 puffs into the lungs every 6 (six) hours as needed for wheezing or shortness of breath. 04/07/23   Roemhildt, Lorin T, PA-C  amLODipine  (NORVASC ) 5 MG tablet Take 1 tablet (5 mg total) by mouth daily. 03/12/22   Newlin, Enobong, MD  amoxicillin -clavulanate (AUGMENTIN ) 875-125 MG tablet Take 1 tablet by mouth every 12 (twelve) hours. 06/12/24   Zackowski, Scott, MD  aspirin  EC 81 MG tablet Take 81 mg by mouth daily. Swallow whole.    [provider]  benzonatate  (TESSALON ) 100 MG capsule Take 1 capsule (100 mg total) by mouth every 8 (eight) hours. 11/18/24   Dufour, Marry RAMAN, PA-C  furosemide (LASIX) 20 MG tablet Take 10 mg by mouth. 08/26/24 08/26/25  [provider]  gabapentin (NEURONTIN) 100 MG capsule Take 100 mg by mouth at bedtime. 02/21/21   [provider]  meclizine  (ANTIVERT ) 25 MG tablet Take 1 tablet (25 mg total) by mouth 3 (three) times daily as needed for dizziness. 01/28/24   Tegeler, Lonni PARAS, MD  Omega-3 Fatty Acids (OMEGA 3 PO) Take 1 capsule by mouth every morning.    [provider]  ondansetron  (ZOFRAN -ODT) 4 MG disintegrating tablet Take 1 tablet (4 mg total) by mouth every 8 (eight) hours as needed for nausea or vomiting. 10/01/23   Raford Lenis, MD  oxyCODONE  (ROXICODONE ) 15 MG immediate release tablet Take 1 tablet by mouth 5 (five) times daily as needed  (pain).    [provider]  pantoprazole  (PROTONIX ) 40 MG tablet Take 1 tablet (40 mg total) by mouth daily. 10/28/24 11/27/24  Veta Palma, PA-C  Semaglutide -Weight Management (WEGOVY ) 0.25 MG/0.5ML SOAJ Inject 0.25 milligram under skin weekly 09/30/23     Semaglutide -Weight Management 0.25 MG/0.5ML SOAJ Inject 0.25 mg into the skin once a week.    [provider]  Vitamin D, Ergocalciferol, (DRISDOL) 1.25 MG (50000 UNIT) CAPS capsule Take 50,000 Units by mouth every Monday. 02/21/21   [provider]    Allergies: Peanut-containing drug products, Peanuts [peanut oil], and Shellfish allergy    Review of Systems Negative except as per HPI Updated Vital Signs BP 128/89 (BP Location: Right Arm)   Pulse 75   Temp 98.2 F (36.8 C) (Oral)   Resp 18   SpO2 97%   Physical Exam Vitals and nursing note reviewed.  Constitutional:      General: He is not in acute distress.    Appearance: He is well-developed. He is not diaphoretic.  HENT:     Head: Normocephalic and atraumatic.     Nose: Nose normal.  Cardiovascular:     Rate and Rhythm: Normal rate and regular rhythm.     Heart sounds: Normal heart sounds.  Pulmonary:     Effort: Pulmonary effort is normal.     Breath sounds: Normal breath sounds.  Skin:    General: Skin is  warm and dry.     Findings: No erythema or rash.  Neurological:     Mental Status: He is alert and oriented to person, place, and time.  Psychiatric:        Behavior: Behavior normal.     (all labs ordered are listed, but only abnormal results are displayed) Labs Reviewed - No data to display  EKG: None  Radiology: No results found.   Procedures   Medications Ordered in the ED - No data to display                                  Medical Decision Making  44 yo male presents to the ER with body aches and SHOB. Lungs CTA, no fevers, no URI symptoms. Offered CXR and labs. Patient is concerned he has the flu and needs flu  medication. Flu is at endemic levels, testing restrictions currently only allow for critically ill/admitted patients. Patient declines labs and CXR. Can send Xofluza  to patient's pharmacy, suggested patient try OTC covid/flu test available OTC.      Final diagnoses:  None    ED Discharge Orders     None          Beverley Leita LABOR, PA-C 12/27/24 1324  "

## 2024-12-27 NOTE — ED Triage Notes (Signed)
 Body ache, headache sob  feel like my asthma is going off Started a few days ago

## 2024-12-27 NOTE — ED Notes (Signed)
 Reviewed AVS/discharge instruction with patient. Time allotted for and all questions answered. Patient is agreeable for d/c and escorted to ed exit by staff.

## 2024-12-28 ENCOUNTER — Emergency Department (HOSPITAL_BASED_OUTPATIENT_CLINIC_OR_DEPARTMENT_OTHER): Admitting: Radiology

## 2024-12-28 ENCOUNTER — Emergency Department (HOSPITAL_COMMUNITY): Admission: EM | Admit: 2024-12-28 | Discharge: 2024-12-28 | Discharge status: Against Medical Advice

## 2024-12-28 ENCOUNTER — Other Ambulatory Visit: Payer: Self-pay

## 2024-12-28 ENCOUNTER — Encounter (HOSPITAL_BASED_OUTPATIENT_CLINIC_OR_DEPARTMENT_OTHER): Payer: Self-pay | Admitting: Emergency Medicine

## 2024-12-28 ENCOUNTER — Emergency Department (HOSPITAL_BASED_OUTPATIENT_CLINIC_OR_DEPARTMENT_OTHER)
Admission: EM | Admit: 2024-12-28 | Discharge: 2024-12-29 | Disposition: A | Discharge status: Home / Self Care | Attending: Emergency Medicine | Admitting: Emergency Medicine

## 2024-12-28 DIAGNOSIS — R002 Palpitations: Secondary | ICD-10-CM | POA: Insufficient documentation

## 2024-12-28 DIAGNOSIS — D72819 Decreased white blood cell count, unspecified: Secondary | ICD-10-CM | POA: Insufficient documentation

## 2024-12-28 DIAGNOSIS — Z7982 Long term (current) use of aspirin: Secondary | ICD-10-CM | POA: Diagnosis not present

## 2024-12-28 DIAGNOSIS — Z91010 Allergy to peanuts: Secondary | ICD-10-CM | POA: Insufficient documentation

## 2024-12-28 LAB — BASIC METABOLIC PANEL WITH GFR
Anion gap: 12 (ref 5–15)
BUN: 12 mg/dL (ref 6–20)
CO2: 26 mmol/L (ref 22–32)
Calcium: 10 mg/dL (ref 8.9–10.3)
Chloride: 100 mmol/L (ref 98–111)
Creatinine, Ser: 1.04 mg/dL (ref 0.61–1.24)
GFR, Estimated: >60 mL/min
Glucose, Bld: 147 mg/dL — ABNORMAL HIGH (ref 70–99)
Potassium: 4.3 mmol/L (ref 3.5–5.1)
Sodium: 138 mmol/L (ref 135–145)

## 2024-12-28 LAB — CBC
HCT: 42.3 % (ref 39.0–52.0)
Hemoglobin: 14.6 g/dL (ref 13.0–17.0)
MCH: 31.5 pg (ref 26.0–34.0)
MCHC: 34.5 g/dL (ref 30.0–36.0)
MCV: 91.2 fL (ref 80.0–100.0)
Platelets: 211 K/uL (ref 150–400)
RBC: 4.64 MIL/uL (ref 4.22–5.81)
RDW: 13.1 % (ref 11.5–15.5)
WBC: 3.9 K/uL — ABNORMAL LOW (ref 4.0–10.5)
nRBC: 0 % (ref 0.0–0.2)

## 2024-12-28 LAB — TROPONIN T, HIGH SENSITIVITY
Troponin T High Sensitivity: <15 ng/L (ref 0–19)
Troponin T High Sensitivity: <15 ng/L (ref 0–19)

## 2024-12-28 NOTE — ED Triage Notes (Signed)
 Pt via pov from home with palpitations that began this morning. He states that his heart rate feels fast and that he has tightness and sob intermittently. Pt a&o x 4; nad noted.

## 2024-12-29 NOTE — ED Provider Notes (Signed)
 " [place] EMERGENCY DEPARTMENT AT [facility] Provider Note   CSN: [id number] Arrival date & time: 12/28/24  2012     Patient presents with: Palpitations   Kristopher Dunn is a 44 y.o. male.   44 year old male who presents ER today with palpitations.  Patient states that he felt like he was a bit sick yesterday with muscle aches, body aches, bone aches, congestion and cough.  Did not take any medicines but did have decreased p.o. intake.  States he woke up this morning and took his wife to work and after he returned he was just relaxing at home and had multiple episodes of feeling his heart beat fast and hard.  Not irregular.  States that he has never had it had this before but has had multiple cardiac workups for chest pain.  Denies any caffeine, alcohol, drugs, tobacco for the last couple years.  Denies any over-the-counter medications today.  Denies any other new intake.  Did not feel lightheaded, pain or shortness of breath with these episodes.  No lower extremity swelling.  Has not missed any doses of his hypertension or IIH medications.   Palpitations      Prior to Admission medications  Medication Sig Start Date End Date Taking? Authorizing Provider  albuterol  (VENTOLIN  HFA) 108 (90 Base) MCG/ACT inhaler Inhale 1-2 puffs into the lungs every 6 (six) hours as needed for wheezing or shortness of breath. 12/27/24   Beverley Leita LABOR, PA-C  amLODipine  (NORVASC ) 5 MG tablet Take 1 tablet (5 mg total) by mouth daily. 03/12/22   Newlin, Enobong, MD  amoxicillin -clavulanate (AUGMENTIN ) 875-125 MG tablet Take 1 tablet by mouth every 12 (twelve) hours. 06/12/24   Zackowski, Scott, MD  aspirin  EC 81 MG tablet Take 81 mg by mouth daily. Swallow whole.    [provider]  benzonatate  (TESSALON ) 100 MG capsule Take 1 capsule (100 mg total) by mouth every 8 (eight) hours. 11/18/24   Dufour, Marry RAMAN, PA-C  furosemide (LASIX) 20 MG tablet Take 10 mg by mouth. 08/26/24 08/26/25   [provider]  gabapentin (NEURONTIN) 100 MG capsule Take 100 mg by mouth at bedtime. 02/21/21   [provider]  meclizine  (ANTIVERT ) 25 MG tablet Take 1 tablet (25 mg total) by mouth 3 (three) times daily as needed for dizziness. 01/28/24   Tegeler, Lonni PARAS, MD  Omega-3 Fatty Acids (OMEGA 3 PO) Take 1 capsule by mouth every morning.    [provider]  ondansetron  (ZOFRAN -ODT) 4 MG disintegrating tablet Take 1 tablet (4 mg total) by mouth every 8 (eight) hours as needed for nausea or vomiting. 10/01/23   Raford Lenis, MD  oxyCODONE  (ROXICODONE ) 15 MG immediate release tablet Take 1 tablet by mouth 5 (five) times daily as needed (pain).    [provider]  pantoprazole  (PROTONIX ) 40 MG tablet Take 1 tablet (40 mg total) by mouth daily. 10/28/24 11/27/24  Veta Palma, PA-C  Semaglutide -Weight Management (WEGOVY ) 0.25 MG/0.5ML SOAJ Inject 0.25 milligram under skin weekly 09/30/23     Semaglutide -Weight Management 0.25 MG/0.5ML SOAJ Inject 0.25 mg into the skin once a week.    [provider]  Vitamin D, Ergocalciferol, (DRISDOL) 1.25 MG (50000 UNIT) CAPS capsule Take 50,000 Units by mouth every Monday. 02/21/21   [provider]    Allergies: Peanut-containing drug products, Peanuts [peanut oil], and Shellfish allergy    Review of Systems  Cardiovascular:  Positive for palpitations.    Updated Vital Signs BP 124/88  Pulse 82   Temp 98.6 F (37 C)   Resp 17   Ht 5' 11 (1.803 m)   Wt (!) 166.9 kg   SpO2 98%   BMI 51.33 kg/m   Physical Exam Vitals and nursing note reviewed.  Constitutional:      Appearance: He is well-developed.  HENT:     Head: Normocephalic and atraumatic.  Cardiovascular:     Rate and Rhythm: Normal rate and regular rhythm.  Pulmonary:     Effort: Pulmonary effort is normal. No respiratory distress.  Abdominal:     General: There is no distension.  Musculoskeletal:        General: Normal range of  motion.     Cervical back: Normal range of motion.  Neurological:     Mental Status: He is alert.     Comments: No altered mental status, able to give full seemingly accurate history.  Face is symmetric, EOM's intact, pupils equal and reactive, vision intact, tongue and uvula midline without deviation, hearing not noticeably abnormal. Taste and smell not tested. Upper and Lower extremity motor 5/5, intact pain perception in distal extremities, 2+ reflexes in biceps, patella and achilles tendons. Able to perform finger to nose normal with both hands. Walks without assistance or evident ataxia.       (all labs ordered are listed, but only abnormal results are displayed) Labs Reviewed  BASIC METABOLIC PANEL WITH GFR - Abnormal; Notable for the following components:      Result Value   Glucose, Bld 147 (*)    All other components within normal limits  CBC - Abnormal; Notable for the following components:   WBC 3.9 (*)    All other components within normal limits  TROPONIN T, HIGH SENSITIVITY  TROPONIN T, HIGH SENSITIVITY    EKG: EKG Interpretation Date/Time:  Wednesday December 28 2024 20:38:58 EST Ventricular Rate:  81 PR Interval:  184 QRS Duration:  100 QT Interval:  352 QTC Calculation: 408 R Axis:   67  Text Interpretation: Normal sinus rhythm Normal ECG When compared with ECG of 25-Dec-2024 20:07, No significant change was found Confirmed by Lorette Mayo [phone]) on 12/29/2024 12:21:00 AM  Radiology: DG Chest 2 View Result Date: 12/28/2024 EXAM: 2 VIEW(S) XRAY OF THE CHEST 12/28/2024 08:52:00 PM COMPARISON: 10/28/2024 CLINICAL HISTORY: cp FINDINGS: LUNGS AND PLEURA: No focal pulmonary opacity. No pleural effusion. No pneumothorax. HEART AND MEDIASTINUM: No acute abnormality of the cardiac and mediastinal silhouettes. BONES AND SOFT TISSUES: Metallic foreign bodies over the left shoulder are unchanged. Punctate radiopaque foreign bodies overlying the left shoulder are unchanged  from the prior study. No new foreign bodies are identified. No acute osseous abnormality. IMPRESSION: 1. No acute cardiopulmonary abnormality. Electronically signed by: Greig Pique MD MD 12/28/2024 09:02 PM EST RP Workstation: [id number]     Procedures   Medications Ordered in the ED - No data to display                                  Medical Decision Making Amount and/or Complexity of Data Reviewed Labs: ordered. Radiology: ordered.   44 year old male who presents to the ER with palpitations.  No obvious etiology.  Low suspicion for V. tach/V-fib/atrial fibrillation.  Could be SVT versus sinus tachycardia.  Secondary to his medical history A-fib is still a possibility and would suggest following up with his cardiologist for longer-term monitoring.  ECG here was reassuring.  Chest x-ray is at baseline.  Electrolytes are within normal limits.  Troponins are within normal limits making an emergent cause for his symptoms unlikely.   Final diagnoses:  Palpitations    ED Discharge Orders     None          Jesslyn Viglione, Selinda, MD 12/29/24 0034  "

## 2024-12-30 ENCOUNTER — Ambulatory Visit: Admitting: Physician Assistant

## 2025-01-04 ENCOUNTER — Encounter: Payer: Self-pay | Admitting: Cardiology

## 2025-01-04 ENCOUNTER — Emergency Department (HOSPITAL_COMMUNITY)
Admission: EM | Admit: 2025-01-04 | Discharge: 2025-01-04 | Disposition: A | Discharge status: Home / Self Care | Attending: Emergency Medicine | Admitting: Emergency Medicine

## 2025-01-04 ENCOUNTER — Emergency Department (HOSPITAL_COMMUNITY)

## 2025-01-04 ENCOUNTER — Other Ambulatory Visit: Payer: Self-pay

## 2025-01-04 ENCOUNTER — Emergency Department (INDEPENDENT_AMBULATORY_CARE_PROVIDER_SITE_OTHER): Admitting: Cardiology

## 2025-01-04 ENCOUNTER — Encounter (HOSPITAL_COMMUNITY): Payer: Self-pay

## 2025-01-04 VITALS — BP 136/82 | HR 67 | Ht 71.0 in | Wt 373.8 lb

## 2025-01-04 DIAGNOSIS — Z6841 Body Mass Index (BMI) 40.0 and over, adult: Secondary | ICD-10-CM

## 2025-01-04 DIAGNOSIS — I10 Essential (primary) hypertension: Secondary | ICD-10-CM

## 2025-01-04 DIAGNOSIS — D72819 Decreased white blood cell count, unspecified: Secondary | ICD-10-CM | POA: Diagnosis not present

## 2025-01-04 DIAGNOSIS — E119 Type 2 diabetes mellitus without complications: Secondary | ICD-10-CM | POA: Diagnosis not present

## 2025-01-04 DIAGNOSIS — Z7982 Long term (current) use of aspirin: Secondary | ICD-10-CM | POA: Diagnosis not present

## 2025-01-04 DIAGNOSIS — R931 Abnormal findings on diagnostic imaging of heart and coronary circulation: Secondary | ICD-10-CM

## 2025-01-04 DIAGNOSIS — G4733 Obstructive sleep apnea (adult) (pediatric): Secondary | ICD-10-CM | POA: Diagnosis not present

## 2025-01-04 DIAGNOSIS — Z7984 Long term (current) use of oral hypoglycemic drugs: Secondary | ICD-10-CM | POA: Diagnosis not present

## 2025-01-04 DIAGNOSIS — E66813 Obesity, class 3: Secondary | ICD-10-CM | POA: Diagnosis not present

## 2025-01-04 DIAGNOSIS — R072 Precordial pain: Secondary | ICD-10-CM

## 2025-01-04 DIAGNOSIS — R0789 Other chest pain: Secondary | ICD-10-CM | POA: Insufficient documentation

## 2025-01-04 DIAGNOSIS — Z79899 Other long term (current) drug therapy: Secondary | ICD-10-CM | POA: Diagnosis not present

## 2025-01-04 DIAGNOSIS — Z91010 Allergy to peanuts: Secondary | ICD-10-CM | POA: Insufficient documentation

## 2025-01-04 LAB — BASIC METABOLIC PANEL WITH GFR
Anion gap: 13 (ref 5–15)
BUN: 13 mg/dL (ref 6–20)
CO2: 22 mmol/L (ref 22–32)
Calcium: 9.2 mg/dL (ref 8.9–10.3)
Chloride: 102 mmol/L (ref 98–111)
Creatinine, Ser: 1 mg/dL (ref 0.61–1.24)
GFR, Estimated: >60 mL/min
Glucose, Bld: 110 mg/dL — ABNORMAL HIGH (ref 70–99)
Potassium: 3.7 mmol/L (ref 3.5–5.1)
Sodium: 137 mmol/L (ref 135–145)

## 2025-01-04 LAB — CBC
HCT: 40.5 % (ref 39.0–52.0)
Hemoglobin: 13.8 g/dL (ref 13.0–17.0)
MCH: 31.7 pg (ref 26.0–34.0)
MCHC: 34.1 g/dL (ref 30.0–36.0)
MCV: 92.9 fL (ref 80.0–100.0)
Platelets: 202 K/uL (ref 150–400)
RBC: 4.36 MIL/uL (ref 4.22–5.81)
RDW: 13.2 % (ref 11.5–15.5)
WBC: 3.9 K/uL — ABNORMAL LOW (ref 4.0–10.5)
nRBC: 0 % (ref 0.0–0.2)

## 2025-01-04 LAB — TROPONIN T, HIGH SENSITIVITY
Troponin T High Sensitivity: <15 ng/L (ref 0–19)
Troponin T High Sensitivity: <15 ng/L (ref 0–19)

## 2025-01-04 MED ORDER — PREDNISONE 20 MG PO TABS: 40.0000 mg | ORAL_TABLET | Freq: Every day | ORAL | 0 refills | Status: DC

## 2025-01-04 MED ORDER — PREDNISONE 20 MG PO TABS: 40.0000 mg | ORAL_TABLET | Freq: Every day | ORAL | 0 refills | Status: AC

## 2025-01-04 NOTE — ED Notes (Signed)
 Pt gives verbal consent for mse

## 2025-01-04 NOTE — Discharge Instructions (Signed)
 As discussed, your evaluation today has been largely reassuring.  But, it is important that you monitor your condition carefully, and do not hesitate to return to the ED if you develop new, or concerning changes in your condition. ? ?Otherwise, please follow-up with your physician for appropriate ongoing care. ? ?

## 2025-01-04 NOTE — ED Provider Notes (Signed)
 " [place] EMERGENCY DEPARTMENT AT [facility] Provider Note   CSN: [id number] Arrival date & time: 01/04/25  9291     Patient presents with: Chest Pain Patient notes episodic chest pain left anterior superior axilla for some time, worse over the past day, including when he was at the gym today.  Patient has had prior evaluation is scheduled to follow-up with cardiology next week no dyspnea, no syncope, no vomiting.  Jonte ISSAK GOLEY is a 44 y.o. male.   HPI     Prior to Admission medications  Medication Sig Start Date End Date Taking? Authorizing Provider  albuterol  (VENTOLIN  HFA) 108 (90 Base) MCG/ACT inhaler Inhale 1-2 puffs into the lungs every 6 (six) hours as needed for wheezing or shortness of breath. 12/27/24   Beverley Leita LABOR, PA-C  amLODipine  (NORVASC ) 5 MG tablet Take 1 tablet (5 mg total) by mouth daily. 03/12/22   Newlin, Enobong, MD  amoxicillin -clavulanate (AUGMENTIN ) 875-125 MG tablet Take 1 tablet by mouth every 12 (twelve) hours. 06/12/24   Zackowski, Scott, MD  aspirin  EC 81 MG tablet Take 81 mg by mouth daily. Swallow whole.    [provider]  benzonatate  (TESSALON ) 100 MG capsule Take 1 capsule (100 mg total) by mouth every 8 (eight) hours. 11/18/24   Dufour, Marry RAMAN, PA-C  furosemide (LASIX) 20 MG tablet Take 10 mg by mouth. 08/26/24 08/26/25  [provider]  gabapentin (NEURONTIN) 100 MG capsule Take 100 mg by mouth at bedtime. 02/21/21   [provider]  meclizine  (ANTIVERT ) 25 MG tablet Take 1 tablet (25 mg total) by mouth 3 (three) times daily as needed for dizziness. 01/28/24   Tegeler, Lonni PARAS, MD  Omega-3 Fatty Acids (OMEGA 3 PO) Take 1 capsule by mouth every morning.    [provider]  ondansetron  (ZOFRAN -ODT) 4 MG disintegrating tablet Take 1 tablet (4 mg total) by mouth every 8 (eight) hours as needed for nausea or vomiting. 10/01/23   Raford Lenis, MD  oxyCODONE  (ROXICODONE ) 15 MG immediate release tablet  Take 1 tablet by mouth 5 (five) times daily as needed (pain).    [provider]  pantoprazole  (PROTONIX ) 40 MG tablet Take 1 tablet (40 mg total) by mouth daily. 10/28/24 11/27/24  Veta Palma, PA-C  predniSONE  (DELTASONE ) 20 MG tablet Take 2 tablets (40 mg total) by mouth daily with breakfast. For the next four days 01/04/25   Garrick Charleston, MD  Semaglutide -Weight Management (WEGOVY ) 0.25 MG/0.5ML SOAJ Inject 0.25 milligram under skin weekly 09/30/23     Semaglutide -Weight Management 0.25 MG/0.5ML SOAJ Inject 0.25 mg into the skin once a week.    [provider]  Vitamin D, Ergocalciferol, (DRISDOL) 1.25 MG (50000 UNIT) CAPS capsule Take 50,000 Units by mouth every Monday. 02/21/21   [provider]    Allergies: Peanut-containing drug products, Peanuts [peanut oil], and Shellfish allergy    Review of Systems  Updated Vital Signs BP (!) 159/89   Pulse 87   Temp 98 F (36.7 C)   Resp 19   SpO2 99%   Physical Exam Vitals and nursing note reviewed.  Constitutional:      General: He is not in acute distress.    Appearance: He is well-developed.  HENT:     Head: Normocephalic and atraumatic.  Eyes:     Conjunctiva/sclera: Conjunctivae normal.  Cardiovascular:     Rate and Rhythm: Normal rate and regular rhythm.  Pulmonary:     Effort: Pulmonary effort is normal.  No respiratory distress.     Breath sounds: No stridor.  Abdominal:     General: There is no distension.  Skin:    General: Skin is warm and dry.  Neurological:     Mental Status: He is alert and oriented to person, place, and time.     (all labs ordered are listed, but only abnormal results are displayed) Labs Reviewed  BASIC METABOLIC PANEL WITH GFR - Abnormal; Notable for the following components:      Result Value   Glucose, Bld 110 (*)    All other components within normal limits  CBC - Abnormal; Notable for the following components:   WBC 3.9 (*)    All other components  within normal limits  TROPONIN T, HIGH SENSITIVITY  TROPONIN T, HIGH SENSITIVITY    EKG: EKG Interpretation Date/Time:  Wednesday January 04 2025 07:22:19 EST Ventricular Rate:  76 PR Interval:  196 QRS Duration:  104 QT Interval:  366 QTC Calculation: 411 R Axis:   40  Text Interpretation: Normal sinus rhythm No significant change since last tracing Confirmed by Garrick Charleston [phone]) on 01/04/2025 10:16:02 AM  Radiology: DG Chest 2 View Result Date: 01/04/2025 EXAM: 2 VIEW(S) XRAY OF THE CHEST 01/04/2025 07:32:00 AM COMPARISON: 12/28/2024 CLINICAL HISTORY: The patient presents with chest pain. FINDINGS: LUNGS AND PLEURA: No focal pulmonary opacity. No pleural effusion. No pneumothorax. HEART AND MEDIASTINUM: No acute abnormality of the cardiac and mediastinal silhouettes. BONES AND SOFT TISSUES: Metallic foreign bodies over left shoulder, unchanged. Thoracic degenerative changes. No acute osseous abnormality. IMPRESSION: 1. No acute cardiopulmonary abnormality. 2. Metallic foreign bodies projecting over the left shoulder, unchanged. Thoracic degenerative changes. Electronically signed by: Evalene Coho MD 01/04/2025 07:38 AM EST RP Workstation: [id number]     Procedures   Medications Ordered in the ED - No data to display                                  Medical Decision Making Adult male, large, history of cocaine abuse on chart, hypertension, diabetes thus elevated risk profile presents with chest pain.  Recurrent characteristics are somewhat reassuring, physical exam reassuring. Initial vitals reassuring. ACS versus musculoskeletal versus soft tissue lesion less likely pneumonia with no respiratory complaints.  Amount and/or Complexity of Data Reviewed External Data Reviewed: notes. Labs: ordered. Decision-making details documented in ED Course. Radiology: ordered and independent interpretation performed. Decision-making details documented in ED Course. ECG/medicine  tests: ordered and independent interpretation performed. Decision-making details documented in ED Course.   11:14 AM On repeat exam patient in no distress, we discussed to normal troponin, nonischemic EKG, importance of follow-up, cardiology as scheduled in 1 week.  No evidence for pneumonia, bacteremia, sepsis, patient discharged in stable condition.      Final diagnoses:  Atypical chest pain    ED Discharge Orders          Ordered    predniSONE  (DELTASONE ) 20 MG tablet  Daily with breakfast,   Status:  Discontinued        01/04/25 1113    predniSONE  (DELTASONE ) 20 MG tablet  Daily with breakfast        01/04/25 1114               Garrick Charleston, MD 01/04/25 1114  "

## 2025-01-04 NOTE — Patient Instructions (Signed)
 Medication Instructions:  Your physician recommends that you continue on your current medications as directed. Please refer to the Current Medication list given to you today.  *If you need a refill on your cardiac medications before your next appointment, please call your pharmacy*  Lab Work: None ordered If you have labs (blood work) drawn today and your tests are completely normal, you will receive your results only by: MyChart Message (if you have MyChart) OR A paper copy in the mail If you have any lab test that is abnormal or we need to change your treatment, we will call you to review the results.  Testing/Procedures: Echocardiogram  Coronary CT Angiography  Follow-Up: At [facility], you and your health needs are our priority.  As part of our continuing mission to provide you with exceptional heart care, our providers are all part of one team.  This team includes your primary Cardiologist (physician) and Advanced Practice Providers or APPs (Physician Assistants and Nurse Practitioners) who all work together to provide you with the care you need, when you need it.  Your next appointment:   1 year(s)  Provider:   Madonna Large, DO    We recommend signing up for the patient portal called MyChart.  Sign up information is provided on this After Visit Summary.  MyChart is used to connect with patients for Virtual Visits (Telemedicine).  Patients are able to view lab/test results, encounter notes, upcoming appointments, etc.  Non-urgent messages can be sent to your provider as well.   To learn more about what you can do with MyChart, go to [email or web address].   Other Instructions Your physician has requested that you have an echocardiogram. Echocardiography is a painless test that uses sound waves to create images of your heart. It provides your doctor with information about the size and shape of your heart and how well your hearts chambers and valves are working. This  procedure takes approximately one hour. There are no restrictions for this procedure. Please do NOT wear cologne, perfume, aftershave, or lotions (deodorant is allowed). Please arrive 15 minutes prior to your appointment time.  Please note: We ask at that you not bring children with you during ultrasound (echo/ vascular) testing. Due to room size and safety concerns, children are not allowed in the ultrasound rooms during exams. Our front office staff cannot provide observation of children in our lobby area while testing is being conducted. An adult accompanying a patient to their appointment will only be allowed in the ultrasound room at the discretion of the ultrasound technician under special circumstances. We apologize for any inconvenience.     Your cardiac CT will be scheduled at   [facility] [address]  [place], [place] [address]  At the Heart and [facility] at [address], please enter the parking lot using the [address] entrance and use the FREE valet service at the patient drop-off area. Enter the building and check-in with registration on the main floor.   Please follow these instructions carefully (unless otherwise directed):  An IV will be required for this test and Nitroglycerin  will be given.   On the Night Before the Test: Be sure to Drink plenty of water. Do not consume any caffeinated/decaffeinated beverages or chocolate 12 hours prior to your test. Do not take any antihistamines 12 hours prior to your test.  On the Day of the Test: Drink plenty of water until 1 hour prior to the test. Do not eat  any food 1 hour prior to test. You may take your regular medications prior to the test.  Take 25 mg Atenolol  (Tenormin ) two hours prior to test. Please HOLD Furosemide on the morning of the test. Patients who wear a continuous glucose monitor MUST remove the device prior to scanning.  After the Test: Drink plenty of  water. After receiving IV contrast, you may experience a mild flushed feeling. This is normal. On occasion, you may experience a mild rash up to 24 hours after the test. This is not dangerous. If this occurs, you can take Benadryl  25 mg, Zyrtec, Claritin, or Allegra and increase your fluid intake. (Patients taking Tikosyn should avoid Benadryl , and may take Zyrtec, Claritin, or Allegra) If you experience trouble breathing, this can be serious. If it is severe call 911 IMMEDIATELY. If it is mild, please call our office.  We will call to schedule your test 2-4 weeks out understanding that some insurance companies will need an authorization prior to the service being performed.   For more information and frequently asked questions, please visit our website : [email or web address]  For non-scheduling related questions, please contact the cardiac imaging nurse navigator should you have any questions/concerns: Cardiac Imaging Nurse Navigators Direct Office Dial: [phone]   For scheduling needs, including cancellations and rescheduling, please call Brittany, [phone].

## 2025-01-04 NOTE — ED Triage Notes (Signed)
 Pt. Stated, I was working out at the gym and started having chest pain. The pain was last night went away and then came back. It happens all the time .

## 2025-01-04 NOTE — Progress Notes (Signed)
 " Cardiology Office Note:  .   Date:  01/04/2025  ID:  Kristopher Dunn, DOB [birth year], MRN [id number] PCP:  Siganporia, Arnaz, FNP  Former Cardiology Providers: N/A [facility] Providers Cardiologist:  Madonna Large, DO , [facility] (established care 03/18/2021) Electrophysiologist:  None  Click to update primary MD,subspecialty MD or APP then REFRESH:1}    Chief Complaint  Patient presents with   Follow-up    Chest pain, ED visits    History of Present Illness: .   Kristopher Dunn is a 44 y.o. African-American male whose past medical history and cardiovascular risk factors includes: hypertension, History of COVID-19 infection (January 2022), obesity due to excess calories, sleep apnea on CPAP, former smoker, history of cocaine use, family history of premature coronary artery disease.   Patient was last seen in the office in December 2023 and presents today for evaluation of chest pain.  Chest Pain Onset: 2 year ago  Last occurrence: today morning while walking on the treadmill  Location: left lateral chest wall Intensity: 8/10 Frequency: anterior chest wall pain is everyday; but lateral wall pain is today only Duration: 6.5 hrs today, otherwise it stays there everyday.  Improving factors: none Worsening factors: none No chest pain with effort related activities.  Are symptoms positional / pleuritic: no  Stress test at [facility] back in June 2025 - No evidence of ischemia or infarct.   Has seen a GI doctor in the past - has an EGD and colonoscopy on 02/21/2025 at [facility].   Has not tried Tylenol  or motrin .   Went to ED today - hs troponin negative times two.   Review of Systems: .   Review of Systems  Cardiovascular:  Negative for chest pain, claudication, irregular heartbeat, leg swelling, near-syncope, orthopnea, palpitations, paroxysmal nocturnal dyspnea and syncope.  Respiratory:  Negative for shortness of breath.   Hematologic/Lymphatic: Negative for  bleeding problem.    Studies Reviewed:   EKG: 01/04/2025: Sinus rhythm, 76 bpm, without underlying injury pattern  Echocardiogram: 04/01/2021: Normal LV systolic function with visual EF 60-65%. Left ventricle cavity is normal in size. Mild left ventricular hypertrophy. Normal global wall motion. Normal diastolic filling pattern, normal LAP.  No significant valvular abnormalities. The aortic root is normal. Mild dilatation at the Sinotubular junction (3.97cm).  Prior study 07/26/2012: LVEF 60-65%, mild LVH, Grade 1DD, Mild MR.    NM Cardiac SPECT (At [facility] everywhere) 06/10/2024 No evidence of ischemia or infarct.    Coronary CTA 04/18/2021: 1. Total coronary calcium score of 12.4. 2. Normal coronary origin. 3. CAD-RADS = 1. Minimal stenosis (0-24%) proximal LAD due to calcified plaque.   RADIOLOGY: Chest x-ray January 04, 2025: 1. No acute cardiopulmonary abnormality. 2. Metallic foreign bodies projecting over the left shoulder, unchanged. Thoracic degenerative changes.  Risk Assessment/Calculations:   NA   Labs:       Latest Ref Rng & Units 01/04/2025    7:26 AM 12/28/2024    8:41 PM 12/25/2024    7:45 PM  CBC  WBC 4.0 - 10.5 K/uL 3.9  3.9  4.3   Hemoglobin 13.0 - 17.0 g/dL 86.1  85.3  85.1   Hematocrit 39.0 - 52.0 % 40.5  42.3  45.2   Platelets 150 - 400 K/uL 202  211  225        Latest Ref Rng & Units 01/04/2025    7:26 AM 12/28/2024    8:41 PM 12/25/2024    7:45 PM  BMP  Glucose 70 - 99 mg/dL 889  852  867   BUN 6 - 20 mg/dL 13  12  14    Creatinine 0.61 - 1.24 mg/dL 8.99  8.95  8.55   Sodium 135 - 145 mmol/L 137  138  139   Potassium 3.5 - 5.1 mmol/L 3.7  4.3  4.2   Chloride 98 - 111 mmol/L 102  100  100   CO2 22 - 32 mmol/L 22  26  28    Calcium 8.9 - 10.3 mg/dL 9.2  89.9  9.8       Latest Ref Rng & Units 01/04/2025    7:26 AM 12/28/2024    8:41 PM 12/25/2024    7:45 PM  CMP  Glucose 70 - 99 mg/dL 889  852  867   BUN 6 - 20 mg/dL 13  12  14    Creatinine  0.61 - 1.24 mg/dL 8.99  8.95  8.55   Sodium 135 - 145 mmol/L 137  138  139   Potassium 3.5 - 5.1 mmol/L 3.7  4.3  4.2   Chloride 98 - 111 mmol/L 102  100  100   CO2 22 - 32 mmol/L 22  26  28    Calcium 8.9 - 10.3 mg/dL 9.2  89.9  9.8   Total Protein 6.5 - 8.1 g/dL   7.9   Total Bilirubin 0.0 - 1.2 mg/dL   0.4   Alkaline Phos 38 - 126 U/L   73   AST 15 - 41 U/L   32   ALT 0 - 44 U/L   63     Lab Results  Component Value Date   CHOL 145 03/26/2022   HDL 41 03/26/2022   LDLCALC 80 03/26/2022   TRIG 137 03/26/2022   No results for input(s): LIPOA in the last 8760 hours. No components found for: NTPROBNP No results for input(s): PROBNP in the last 8760 hours. No results for input(s): TSH in the last 8760 hours.   Physical Exam:    Today's Vitals   01/04/25 1314  BP: 136/82  Pulse: 67  SpO2: 96%  Weight: (!) 373 lb 12.8 oz (169.6 kg)  Height: 5' 11 (1.803 m)   Body mass index is 52.13 kg/m. Wt Readings from Last 3 Encounters:  01/04/25 (!) 373 lb 12.8 oz (169.6 kg)  12/28/24 (!) 368 lb (166.9 kg)  12/25/24 (!) 367 lb 15.2 oz (166.9 kg)    Physical Exam  Constitutional: No distress.  hemodynamically stable  Neck: No JVD present.  Cardiovascular: Normal rate, regular rhythm, S1 normal and S2 normal. Exam reveals no gallop, no S3 and no S4.  No murmur heard. Pulmonary/Chest: Effort normal and breath sounds normal. No stridor. He has no wheezes. He has no rales.  Musculoskeletal:        General: No edema.     Cervical back: Neck supple.  Skin: Skin is warm.   Impression & Recommendation(s):  Impression:   ICD-10-CM   1. Precordial pain  R07.2 ECHOCARDIOGRAM COMPLETE    CT CORONARY MORPH W/CTA COR W/SCORE W/CA W/CM &/OR WO/CM    2. Agatston coronary artery calcium score less than 100  R93.1     3. Essential hypertension  I10     4. Obstructive sleep apnea  G47.33     5. Class 3 severe obesity due to excess calories with serious comorbidity and body mass  index (BMI) of 50.0 to 59.9 in adult [facility])  Z33.186    S31.56  Recommendation(s):  Precordial pain Agatston coronary artery calcium score less than 100 Chronic, ongoing for the last 2 years with recent exacerbation. Based on symptoms entirely noncardiac. Had a coronary CTA back in 2022 and a nuclear stress test in June 2025 at [facility] The records from [facility] are not available in Care Everywhere but images are not available for review EKG nonischemic High sensitive troponins negative x 2 Has had multiple ER visits due to concerns of chest pain. Reassurance provided-the patient prefers additional testing given the frequency of symptoms. Echo will be ordered to evaluate for structural heart disease and left ventricular systolic function. Coronary CTA to evaluate for CAC, plaque burden, and obstructive disease Already on atenolol  12.5 mg p.o. daily, on the day of his coronary CTA patient advised to take atenolol  25 mg 2 hours prior to the study. Further recommendations forthcoming. Advised to take over-the-counter Tylenol  for pain control to see if it is costochondritis. Also recommended evaluating noncardiac causes of chest pain-patient states that he has EGD and colonoscopy in March 2026 Patient is also encouraged second opinion with another cardiologist.  Of note, he had testing at [facility] but did not establish care with cardiology according to him.  Essential hypertension Office blood pressure is acceptable. Currently on amlodipine  10 mg p.o. daily. Currently on Lasix 20 mg p.o. daily  Obstructive sleep apnea Endorses compliance with the CPAP  Class 3 severe obesity due to excess calories with serious comorbidity and body mass index (BMI) of 50.0 to 59.9 in adult (HCC) Body mass index is 52.13 kg/m. I reviewed with him importance of diet, regular physical activity/exercise, weight loss.   Patient is educated on the importance of increasing physical activity  gradually as tolerated with a goal of moderate intensity exercise for 30 minutes a day 5 days a week.  Orders Placed:  Orders Placed This Encounter  Procedures   CT CORONARY MORPH W/CTA COR W/SCORE W/CA W/CM &/OR WO/CM    Standing Status:   Future    Expected Date:   01/11/2025    Expiration Date:   01/04/2026    If indicated for the ordered procedure, I authorize the administration of contrast media per Radiology protocol:   Yes    Does the patient have a contrast media/X-ray dye allergy?:   No    [facility]    Authorization::   Per Dr. Michele for Precodial Pain, BMI 52   ECHOCARDIOGRAM COMPLETE    Standing Status:   Future    Expected Date:   01/11/2025    Expiration Date:   01/04/2026    Where should this test be performed:   [facility]    Does the patient weigh less than or greater than 250 lbs?:   Patient weighs greater than 250 lbs    Perflutren DEFINITY (image enhancing agent) should be administered unless hypersensitivity or allergy exist:   Administer Perflutren    Reason for exam-Echo:   Chest Pain  R07.9     Final Medication List:   No orders of the defined types were placed in this encounter.   Medications Discontinued During This Encounter  Medication Reason   amLODipine  (NORVASC ) 5 MG tablet     Current Medications[1]  Consent:   NA  Disposition:   1 year follow-up as long as echo and coronary CTA results are overall favorable, sooner if needed  His questions and concerns were addressed to his satisfaction. He voices  understanding of the recommendations provided during this encounter.    Signed, Madonna Large, DO, [facility]  A Division of [facility] [address], [place], [place] [address]       [1]  Current Outpatient Medications:    albuterol  (VENTOLIN  HFA) 108 (90 Base) MCG/ACT inhaler, Inhale 1-2 puffs into the lungs every 6 (six) hours as needed for wheezing or  shortness of breath., Disp: 8 g, Rfl: 0   allopurinol (ZYLOPRIM) 100 MG tablet, Take 100 mg by mouth daily., Disp: , Rfl:    amLODipine  (NORVASC ) 10 MG tablet, Take 10 mg by mouth daily., Disp: , Rfl:    amoxicillin -clavulanate (AUGMENTIN ) 875-125 MG tablet, Take 1 tablet by mouth every 12 (twelve) hours., Disp: 14 tablet, Rfl: 0   aspirin  EC 81 MG tablet, Take 81 mg by mouth daily. Swallow whole., Disp: , Rfl:    atenolol  (TENORMIN ) 25 MG tablet, Take 12.5 mg by mouth daily., Disp: , Rfl:    benzonatate  (TESSALON ) 100 MG capsule, Take 1 capsule (100 mg total) by mouth every 8 (eight) hours., Disp: 21 capsule, Rfl: 0   budesonide-formoterol (SYMBICORT) 80-4.5 MCG/ACT inhaler, Inhale 2 puffs into the lungs., Disp: , Rfl:    furosemide (LASIX) 20 MG tablet, Take 10 mg by mouth., Disp: , Rfl:    furosemide (LASIX) 40 MG tablet, Take 40 mg by mouth daily., Disp: , Rfl:    gabapentin (NEURONTIN) 100 MG capsule, Take 100 mg by mouth at bedtime., Disp: , Rfl:    meclizine  (ANTIVERT ) 25 MG tablet, Take 1 tablet (25 mg total) by mouth 3 (three) times daily as needed for dizziness., Disp: 30 tablet, Rfl: 0   Omega-3 Fatty Acids (OMEGA 3 PO), Take 1 capsule by mouth every morning., Disp: , Rfl:    ondansetron  (ZOFRAN -ODT) 4 MG disintegrating tablet, Take 1 tablet (4 mg total) by mouth every 8 (eight) hours as needed for nausea or vomiting., Disp: 20 tablet, Rfl: 0   oxyCODONE  (ROXICODONE ) 15 MG immediate release tablet, Take 1 tablet by mouth 5 (five) times daily as needed (pain)., Disp: , Rfl:    pantoprazole  (PROTONIX ) 40 MG tablet, Take 1 tablet (40 mg total) by mouth daily., Disp: 30 tablet, Rfl: 0   predniSONE  (DELTASONE ) 20 MG tablet, Take 2 tablets (40 mg total) by mouth daily with breakfast. For the next four days, Disp: 8 tablet, Rfl: 0   Semaglutide -Weight Management (WEGOVY ) 0.25 MG/0.5ML SOAJ, Inject 0.25 milligram under skin weekly, Disp: 2 mL, Rfl: 0   Semaglutide -Weight Management 0.25 MG/0.5ML  SOAJ, Inject 0.25 mg into the skin once a week., Disp: , Rfl:    Vitamin D, Ergocalciferol, (DRISDOL) 1.25 MG (50000 UNIT) CAPS capsule, Take 50,000 Units by mouth every Monday., Disp: , Rfl:   "

## 2025-01-20 ENCOUNTER — Emergency Department (HOSPITAL_COMMUNITY)

## 2025-01-20 ENCOUNTER — Encounter (HOSPITAL_COMMUNITY): Payer: Self-pay

## 2025-01-20 ENCOUNTER — Other Ambulatory Visit: Payer: Self-pay

## 2025-01-20 ENCOUNTER — Emergency Department (HOSPITAL_COMMUNITY)
Admission: EM | Admit: 2025-01-20 | Discharge: 2025-01-20 | Discharge status: Against Medical Advice | Attending: Emergency Medicine | Admitting: Emergency Medicine

## 2025-01-20 DIAGNOSIS — Z5321 Procedure and treatment not carried out due to patient leaving prior to being seen by health care provider: Secondary | ICD-10-CM | POA: Diagnosis not present

## 2025-01-20 DIAGNOSIS — R079 Chest pain, unspecified: Secondary | ICD-10-CM | POA: Diagnosis present

## 2025-01-20 LAB — CBC
HCT: 41.2 % (ref 39.0–52.0)
Hemoglobin: 13.5 g/dL (ref 13.0–17.0)
MCH: 30.5 pg (ref 26.0–34.0)
MCHC: 32.8 g/dL (ref 30.0–36.0)
MCV: 93 fL (ref 80.0–100.0)
Platelets: 211 10*3/uL (ref 150–400)
RBC: 4.43 MIL/uL (ref 4.22–5.81)
RDW: 13 % (ref 11.5–15.5)
WBC: 3.1 10*3/uL — ABNORMAL LOW (ref 4.0–10.5)
nRBC: 0 % (ref 0.0–0.2)

## 2025-01-20 LAB — BASIC METABOLIC PANEL WITH GFR
Anion gap: 12 (ref 5–15)
BUN: 11 mg/dL (ref 6–20)
CO2: 23 mmol/L (ref 22–32)
Calcium: 9.3 mg/dL (ref 8.9–10.3)
Chloride: 102 mmol/L (ref 98–111)
Creatinine, Ser: 0.91 mg/dL (ref 0.61–1.24)
GFR, Estimated: >60 mL/min
Glucose, Bld: 139 mg/dL — ABNORMAL HIGH (ref 70–99)
Potassium: 4 mmol/L (ref 3.5–5.1)
Sodium: 137 mmol/L (ref 135–145)

## 2025-01-20 LAB — TROPONIN T, HIGH SENSITIVITY
Troponin T High Sensitivity: 9 ng/L (ref 0–19)
Troponin T High Sensitivity: 9 ng/L (ref 0–19)

## 2025-01-20 NOTE — ED Triage Notes (Signed)
 Pt is reporting stabbing chest pains on the left side of his chest. Pt reports that this episode started at 1130 am. Pt reports that when the pain started he became short of breath and he felt like he was spinning. Pt reports that these symptoms resolved and he drove himself in to be seen.

## 2025-01-20 NOTE — ED Triage Notes (Signed)
 Pt c/o left sided chest pain that started an hour ago, non radiating.

## 2025-01-20 NOTE — ED Notes (Signed)
 Patient transported to X-ray

## 2025-01-20 NOTE — ED Provider Triage Note (Signed)
 Emergency Medicine Provider Triage Evaluation Note  ASAPH SERENA , a 44 y.o. male  was evaluated in triage.  Pt complains of chest pain. Previous visits for similar, saw cardiology on 1/14, sent for cardiac CT but has not had this yet. Pain feels exactly the same as previous episodes.   Review of Systems  Positive:  Negative:   Physical Exam  BP 125/68   Pulse 91   Temp 98.1 F (36.7 C)   Resp 16   Ht 5' 11 (1.803 m)   Wt (!) 166.9 kg   SpO2 100%   BMI 51.33 kg/m  Gen:   Awake, no distress   Resp:  Normal effort  MSK:   Moves extremities without difficulty  Other:    Medical Decision Making  Medically screening exam initiated at 1:29 PM.  Appropriate orders placed.  Jabe TAYDEN NICHELSON was informed that the remainder of the evaluation will be completed by another provider, this initial triage assessment does not replace that evaluation, and the importance of remaining in the ED until their evaluation is complete.     Nora Lauraine LABOR, PA-C 01/20/25 1330

## 2025-01-22 ENCOUNTER — Other Ambulatory Visit: Payer: Self-pay

## 2025-01-22 ENCOUNTER — Emergency Department (HOSPITAL_BASED_OUTPATIENT_CLINIC_OR_DEPARTMENT_OTHER)
Admission: EM | Admit: 2025-01-22 | Discharge: 2025-01-22 | Disposition: A | Discharge status: Home / Self Care | Attending: Emergency Medicine | Admitting: Emergency Medicine

## 2025-01-22 ENCOUNTER — Emergency Department (HOSPITAL_BASED_OUTPATIENT_CLINIC_OR_DEPARTMENT_OTHER): Admitting: Radiology

## 2025-01-22 DIAGNOSIS — J3489 Other specified disorders of nose and nasal sinuses: Secondary | ICD-10-CM | POA: Diagnosis not present

## 2025-01-22 DIAGNOSIS — R0981 Nasal congestion: Secondary | ICD-10-CM | POA: Diagnosis not present

## 2025-01-22 DIAGNOSIS — J029 Acute pharyngitis, unspecified: Secondary | ICD-10-CM | POA: Diagnosis not present

## 2025-01-22 DIAGNOSIS — R519 Headache, unspecified: Secondary | ICD-10-CM | POA: Insufficient documentation

## 2025-01-22 DIAGNOSIS — J069 Acute upper respiratory infection, unspecified: Secondary | ICD-10-CM

## 2025-01-22 DIAGNOSIS — R059 Cough, unspecified: Secondary | ICD-10-CM | POA: Insufficient documentation

## 2025-01-22 LAB — BASIC METABOLIC PANEL WITH GFR
Anion gap: 11 (ref 5–15)
BUN: 12 mg/dL (ref 6–20)
CO2: 26 mmol/L (ref 22–32)
Calcium: 10 mg/dL (ref 8.9–10.3)
Chloride: 102 mmol/L (ref 98–111)
Creatinine, Ser: 0.88 mg/dL (ref 0.61–1.24)
GFR, Estimated: >60 mL/min
Glucose, Bld: 101 mg/dL — ABNORMAL HIGH (ref 70–99)
Potassium: 4.2 mmol/L (ref 3.5–5.1)
Sodium: 139 mmol/L (ref 135–145)

## 2025-01-22 LAB — CBC
HCT: 40.9 % (ref 39.0–52.0)
Hemoglobin: 13.7 g/dL (ref 13.0–17.0)
MCH: 31.1 pg (ref 26.0–34.0)
MCHC: 33.5 g/dL (ref 30.0–36.0)
MCV: 92.7 fL (ref 80.0–100.0)
Platelets: 199 10*3/uL (ref 150–400)
RBC: 4.41 MIL/uL (ref 4.22–5.81)
RDW: 13.2 % (ref 11.5–15.5)
WBC: 5.1 10*3/uL (ref 4.0–10.5)
nRBC: 0 % (ref 0.0–0.2)

## 2025-01-22 LAB — RESP PANEL BY RT-PCR (RSV, FLU A&B, COVID)  RVPGX2
Influenza A by PCR: NEGATIVE
Influenza B by PCR: NEGATIVE
Resp Syncytial Virus by PCR: NEGATIVE
SARS Coronavirus 2 by RT PCR: NEGATIVE

## 2025-01-22 NOTE — ED Notes (Signed)

## 2025-01-22 NOTE — ED Triage Notes (Signed)
 Pt reports loss of taste, cough, congestion, fever sore throat and nausea.  Pt taking dayquil q4h with minimal relief. AAOx4

## 2025-01-22 NOTE — ED Provider Notes (Signed)
 " [place] EMERGENCY DEPARTMENT AT [facility] Provider Note   CSN: [id number] Arrival date & time: 01/22/25  1453     Patient presents with: Cough   Kristopher Dunn is a 44 y.o. male.    Cough 44 year old male presenting with flulike symptoms.  Patient reports that this has been going on for about a day and a half.  He reports that he is having some headaches, fever, chills, congestion, rhinorrhea, sinus tenderness, body aches.  Patient denies any chest pain.  She reports that has been taking DayQuil and not having a lot of relief.     Prior to Admission medications  Medication Sig Start Date End Date Taking? Authorizing Provider  albuterol  (VENTOLIN  HFA) 108 (90 Base) MCG/ACT inhaler Inhale 1-2 puffs into the lungs every 6 (six) hours as needed for wheezing or shortness of breath. 12/27/24   Beverley Leita LABOR, PA-C  allopurinol (ZYLOPRIM) 100 MG tablet Take 100 mg by mouth daily. 11/21/24   [provider]  amLODipine  (NORVASC ) 10 MG tablet Take 10 mg by mouth daily. 11/04/24   [provider]  amoxicillin -clavulanate (AUGMENTIN ) 875-125 MG tablet Take 1 tablet by mouth every 12 (twelve) hours. 06/12/24   Zackowski, Scott, MD  aspirin  EC 81 MG tablet Take 81 mg by mouth daily. Swallow whole.    [provider]  atenolol  (TENORMIN ) 25 MG tablet Take 12.5 mg by mouth daily. 10/02/23 03/21/25  [provider]  benzonatate  (TESSALON ) 100 MG capsule Take 1 capsule (100 mg total) by mouth every 8 (eight) hours. 11/18/24   Dufour, Marry RAMAN, PA-C  budesonide-formoterol (SYMBICORT) 80-4.5 MCG/ACT inhaler Inhale 2 puffs into the lungs. 07/21/24 07/21/25  [provider]  furosemide (LASIX) 20 MG tablet Take 10 mg by mouth. 08/26/24 08/26/25  [provider]  furosemide (LASIX) 40 MG tablet Take 40 mg by mouth daily.    [provider]  gabapentin (NEURONTIN) 100 MG capsule Take 100 mg by mouth at bedtime. 02/21/21   [provider]  meclizine  (ANTIVERT ) 25 MG tablet Take 1 tablet (25 mg total) by mouth 3 (three) times daily as needed for dizziness. 01/28/24   Tegeler, Lonni PARAS, MD  Omega-3 Fatty Acids (OMEGA 3 PO) Take 1 capsule by mouth every morning.    [provider]  ondansetron  (ZOFRAN -ODT) 4 MG disintegrating tablet Take 1 tablet (4 mg total) by mouth every 8 (eight) hours as needed for nausea or vomiting. 10/01/23   Raford Lenis, MD  oxyCODONE  (ROXICODONE ) 15 MG immediate release tablet Take 1 tablet by mouth 5 (five) times daily as needed (pain).    [provider]  pantoprazole  (PROTONIX ) 40 MG tablet Take 1 tablet (40 mg total) by mouth daily. 10/28/24 01/04/25  Veta Palma, PA-C  predniSONE  (DELTASONE ) 20 MG tablet Take 2 tablets (40 mg total) by mouth daily with breakfast. For the next four days 01/04/25   Garrick Charleston, MD  Semaglutide -Weight Management (WEGOVY ) 0.25 MG/0.5ML SOAJ Inject 0.25 milligram under skin weekly 09/30/23     Semaglutide -Weight Management 0.25 MG/0.5ML SOAJ Inject 0.25 mg into the skin once a week.    [provider]  Vitamin D, Ergocalciferol, (DRISDOL) 1.25 MG (50000 UNIT) CAPS capsule Take 50,000 Units by mouth every Monday. 02/21/21   [provider]    Allergies: Peanut-containing drug products, Peanuts [peanut oil], and Shellfish allergy    Review of Systems  Respiratory:  Positive for cough.   All other systems reviewed and are negative.  Updated Vital Signs BP (!) 140/89 (BP Location: Right Arm)   Pulse 76   Temp 99.3 F (37.4 C)   Resp 19   SpO2 98%   Physical Exam Vitals and nursing note reviewed.  HENT:     Nose: Congestion and rhinorrhea present. Rhinorrhea is clear.     Right Sinus: Frontal sinus tenderness present.     Left Sinus: Frontal sinus tenderness present.     Mouth/Throat:     Lips: Pink.     Mouth: Mucous membranes are moist.     Pharynx: Oropharynx is clear. Uvula midline. No pharyngeal  swelling, oropharyngeal exudate, posterior oropharyngeal erythema, uvula swelling or postnasal drip.     Tonsils: No tonsillar exudate or tonsillar abscesses.  Cardiovascular:     Rate and Rhythm: Normal rate.     Pulses: Normal pulses.  Pulmonary:     Effort: Pulmonary effort is normal.     Breath sounds: Normal breath sounds.  Skin:    General: Skin is warm and dry.  Neurological:     General: No focal deficit present.     Mental Status: He is alert.     (all labs ordered are listed, but only abnormal results are displayed) Labs Reviewed  RESP PANEL BY RT-PCR (RSV, FLU A&B, COVID)  [id number]  BASIC METABOLIC PANEL WITH GFR  CBC    EKG: None  Radiology: DG Chest 2 View Result Date: 01/22/2025 CLINICAL DATA:  Shortness of breath. EXAM: CHEST - 2 VIEW COMPARISON:  01/20/2025. FINDINGS: Trachea is midline. Heart size normal. Lungs are clear. No pleural fluid. IMPRESSION: Negative. Electronically Signed   By: Newell Eke M.D.   On: 01/22/2025 15:31     Procedures   Medications Ordered in the ED - No data to display                                  Medical Decision Making Amount and/or Complexity of Data Reviewed Labs: ordered. Radiology: ordered.   Impression: 44 year old male presenting with flulike symptoms.  Differential diagnosis includes flu, COVID, RSV, pneumonia  Additional History: Patient was able to provide history.  Labs: CBC showed no acute changes.  BMP shows no acute changes.  Respiratory panel shows negative  Imaging: Chest x-ray showed no acute changes.  EKG showed no acute changes and patient was in sinus rhythm.  ED Course/Meds: 44 year old male presenting to the ER with URI symptoms.  Patient was well-appearing and in no acute distress.  Patient reports that he has had the symptoms for about 2 days.  His main complaint was congestion, rhinorrhea, headache, sore throat.  Patient denies any chest pain or shortness of breath.  On physical exam his  lung sounds were clear.  Patient's chest x-ray showed no acute changes.  The likely cause of his symptoms was a viral URI.  I educated on using Tylenol  and ibuprofen  as needed for pain and fever and that he may also use over-the-counter medications to help with any other symptoms.  I recommended that if the symptoms continue he follow-up with his primary care.  I provided strict return precautions.  Patient agreed to the plan stated above.  All questions were answered.  Patient remained stable while in the ER and at discharge.      Final diagnoses:  None    ED Discharge Orders     None  Rosaline Almarie MATSU, [place] 01/22/25 1757  "

## 2025-01-22 NOTE — Discharge Instructions (Addendum)
 You were seen in the ER today for URI symptoms.  Your likely cause for this is a viral URI.  I recommend that you take Tylenol  or ibuprofen  for any fevers.  You may also take other over-the-counter medications to help with other symptoms.  If your symptoms continue I recommend that you follow-up with your primary care.  If you have any worsening symptoms such as chest pain, shortness of breath, worsening fever, or any other symptoms please return to the ER.

## 2025-02-01 ENCOUNTER — Ambulatory Visit (HOSPITAL_COMMUNITY)
